# Patient Record
Sex: Female | Born: 1960 | Race: White | Hispanic: No | Marital: Married | Smoking: Current every day smoker
Health system: Southern US, Community
[De-identification: ages and names within clinical notes are randomized; demographics above are authoritative.]

## PROBLEM LIST (undated history)

## (undated) DIAGNOSIS — C4490 Unspecified malignant neoplasm of skin, unspecified: Secondary | ICD-10-CM

## (undated) DIAGNOSIS — G8929 Other chronic pain: Secondary | ICD-10-CM

## (undated) DIAGNOSIS — M1990 Unspecified osteoarthritis, unspecified site: Secondary | ICD-10-CM

## (undated) DIAGNOSIS — K219 Gastro-esophageal reflux disease without esophagitis: Secondary | ICD-10-CM

## (undated) DIAGNOSIS — I10 Essential (primary) hypertension: Secondary | ICD-10-CM

## (undated) DIAGNOSIS — J449 Chronic obstructive pulmonary disease, unspecified: Secondary | ICD-10-CM

## (undated) DIAGNOSIS — G709 Myoneural disorder, unspecified: Secondary | ICD-10-CM

## (undated) DIAGNOSIS — E785 Hyperlipidemia, unspecified: Secondary | ICD-10-CM

## (undated) DIAGNOSIS — C50919 Malignant neoplasm of unspecified site of unspecified female breast: Secondary | ICD-10-CM

## (undated) HISTORY — PX: TOTAL HIP ARTHROPLASTY: SHX124

## (undated) HISTORY — PX: BACK SURGERY: SHX140

---

## 2001-12-28 ENCOUNTER — Emergency Department (HOSPITAL_COMMUNITY): Admission: EM | Admit: 2001-12-28 | Discharge: 2001-12-28 | Payer: Self-pay | Admitting: *Deleted

## 2001-12-30 ENCOUNTER — Ambulatory Visit (HOSPITAL_COMMUNITY): Admission: RE | Admit: 2001-12-30 | Discharge: 2001-12-30 | Payer: Self-pay | Admitting: *Deleted

## 2001-12-30 ENCOUNTER — Encounter: Payer: Self-pay | Admitting: *Deleted

## 2001-12-30 ENCOUNTER — Emergency Department (HOSPITAL_COMMUNITY): Admission: EM | Admit: 2001-12-30 | Discharge: 2001-12-30 | Payer: Self-pay | Admitting: Emergency Medicine

## 2002-01-03 ENCOUNTER — Emergency Department (HOSPITAL_COMMUNITY): Admission: EM | Admit: 2002-01-03 | Discharge: 2002-01-03 | Payer: Self-pay | Admitting: Emergency Medicine

## 2002-01-12 ENCOUNTER — Observation Stay (HOSPITAL_COMMUNITY): Admission: RE | Admit: 2002-01-12 | Discharge: 2002-01-13 | Payer: Self-pay | Admitting: Neurosurgery

## 2002-01-12 ENCOUNTER — Encounter: Payer: Self-pay | Admitting: Neurosurgery

## 2003-08-27 ENCOUNTER — Emergency Department (HOSPITAL_COMMUNITY): Admission: EM | Admit: 2003-08-27 | Discharge: 2003-08-27 | Payer: Self-pay | Admitting: Emergency Medicine

## 2004-05-27 ENCOUNTER — Emergency Department: Payer: Self-pay | Admitting: General Practice

## 2005-08-09 ENCOUNTER — Emergency Department (HOSPITAL_COMMUNITY): Admission: EM | Admit: 2005-08-09 | Discharge: 2005-08-09 | Payer: Self-pay | Admitting: Emergency Medicine

## 2005-09-29 ENCOUNTER — Other Ambulatory Visit: Payer: Self-pay

## 2005-09-29 ENCOUNTER — Emergency Department: Payer: Self-pay | Admitting: Emergency Medicine

## 2005-10-17 ENCOUNTER — Emergency Department: Payer: Self-pay | Admitting: Unknown Physician Specialty

## 2005-10-31 ENCOUNTER — Ambulatory Visit: Payer: Self-pay | Admitting: General Surgery

## 2006-01-01 ENCOUNTER — Ambulatory Visit: Payer: Self-pay

## 2006-12-31 ENCOUNTER — Ambulatory Visit: Payer: Self-pay | Admitting: Emergency Medicine

## 2007-05-26 ENCOUNTER — Emergency Department: Payer: Self-pay | Admitting: Emergency Medicine

## 2008-04-05 ENCOUNTER — Ambulatory Visit: Payer: Self-pay | Admitting: Sports Medicine

## 2008-05-01 IMAGING — US ABDOMEN ULTRASOUND
1 series · 17 of 25 positions shown · non-contrast
Comparison: none

REASON FOR EXAM: Abdominal pain. Patient in RME 2
COMMENTS:

[Series 1: abdomen ultrasound · 17 of 74 slices shown]
[im 1/74]
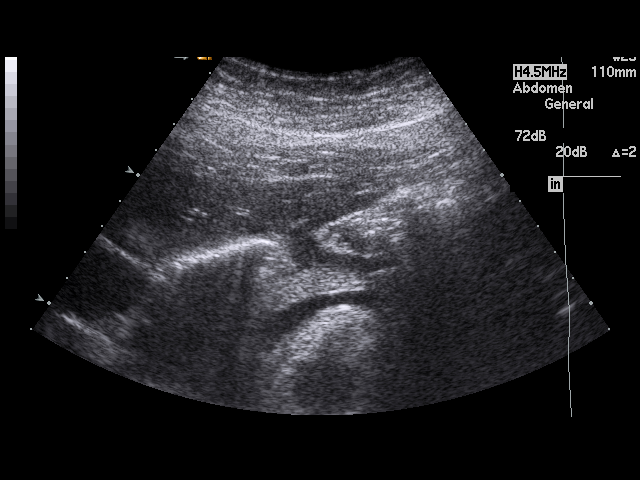
[im 7/74]
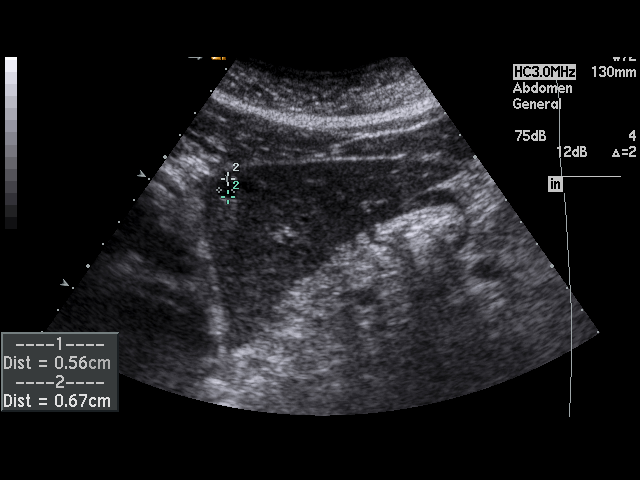
[im 10/74]
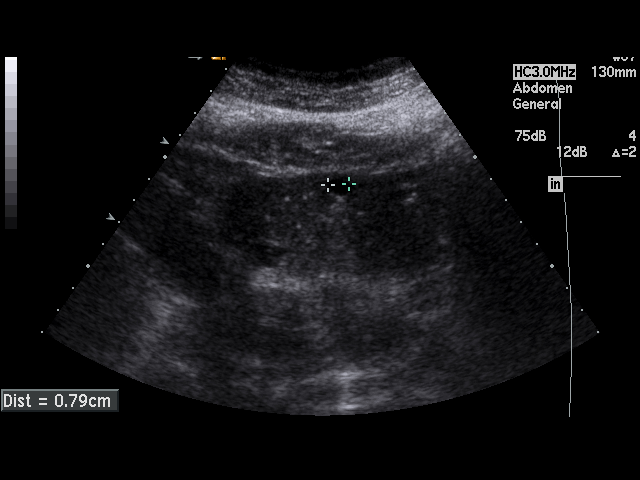
[im 16/74]
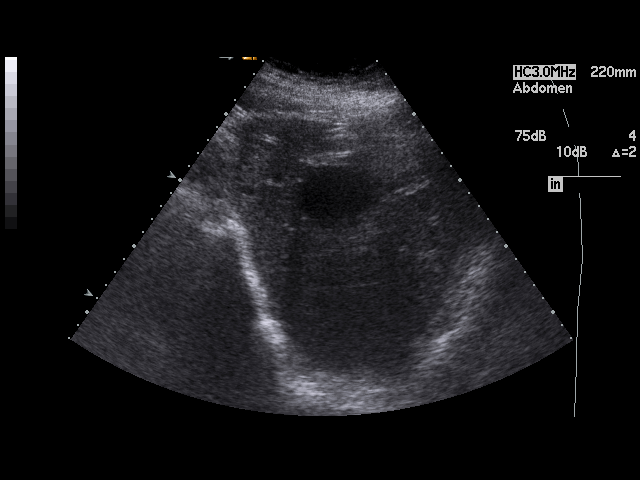
[im 19/74]
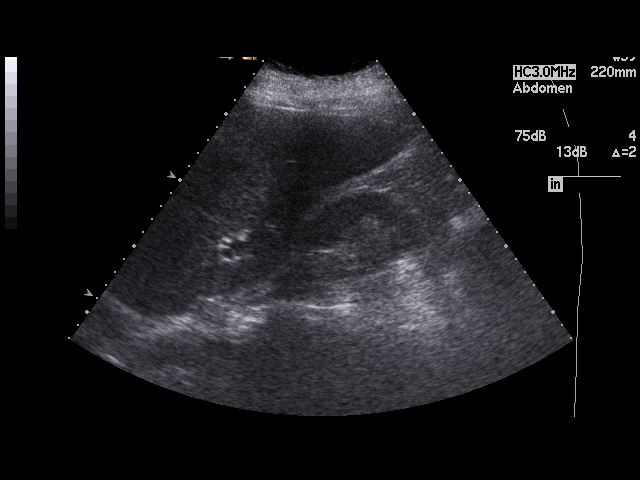
[im 25/74]
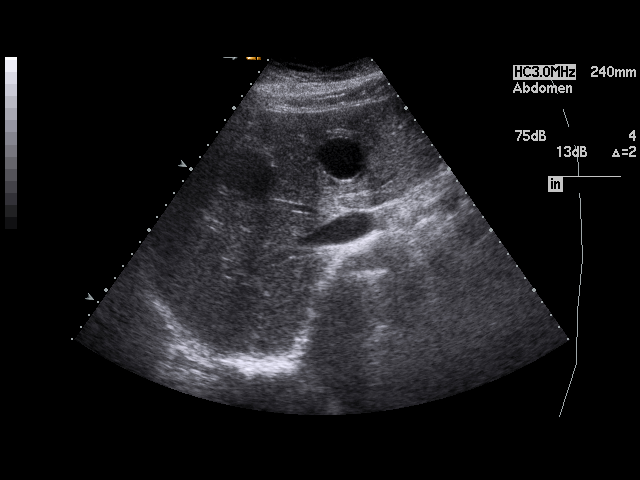
[im 28/74]
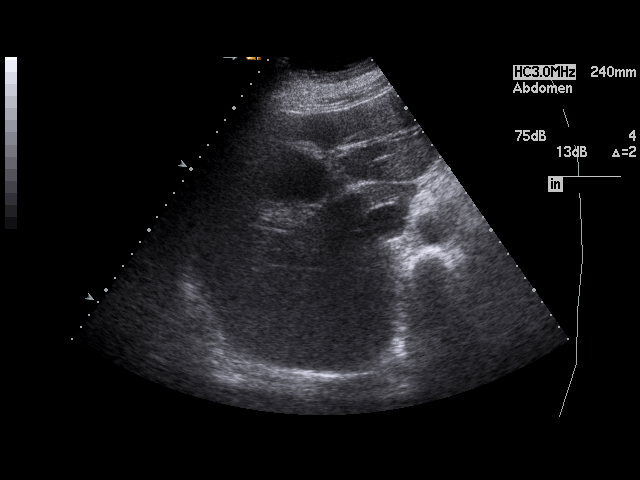
[im 34/74]
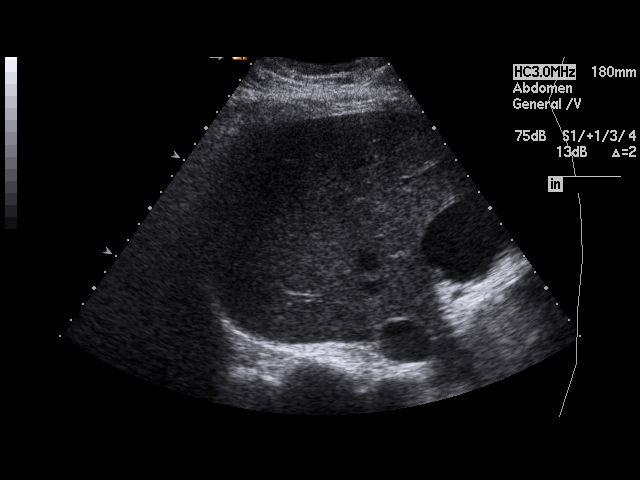
[im 37/74]
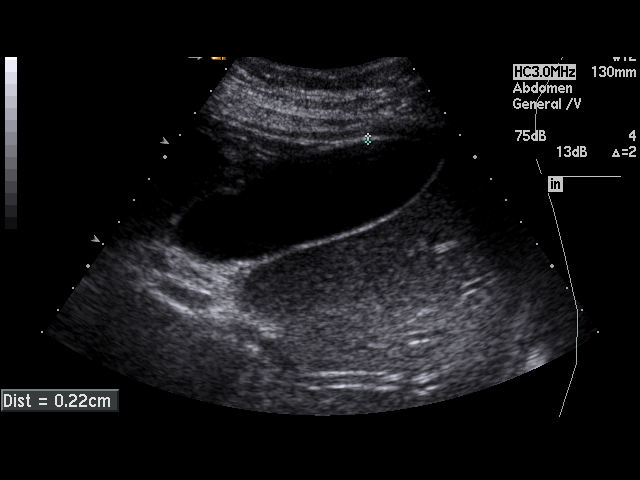
[im 40/74]
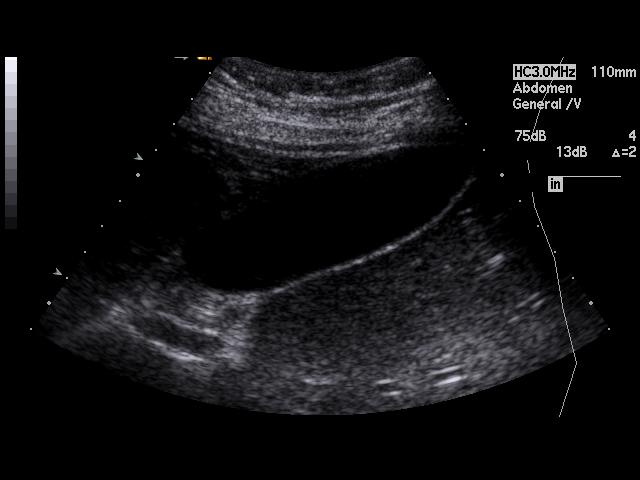
[im 46/74]
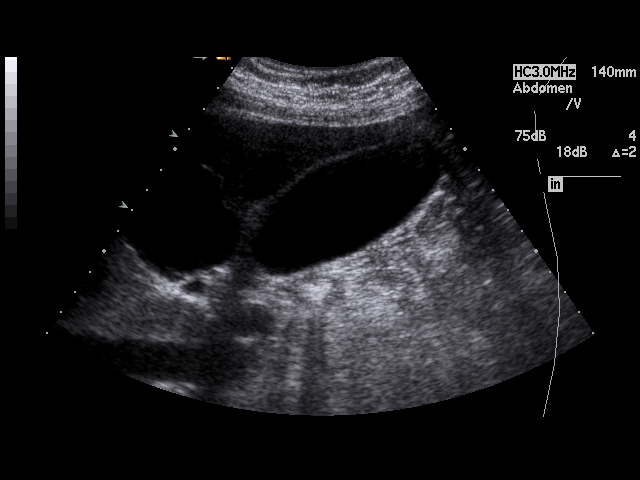
[im 49/74]
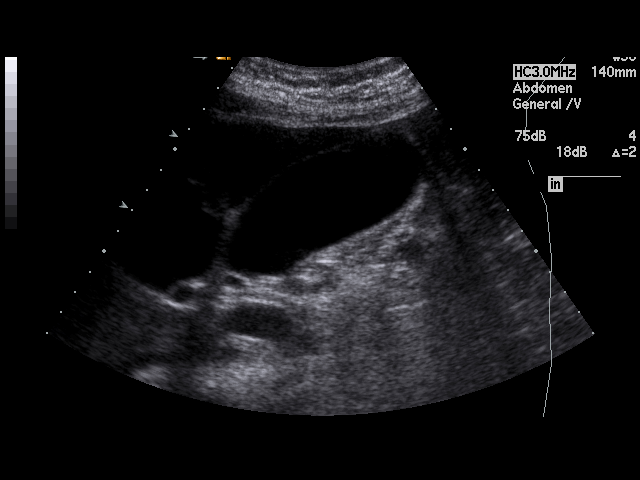
[im 55/74]
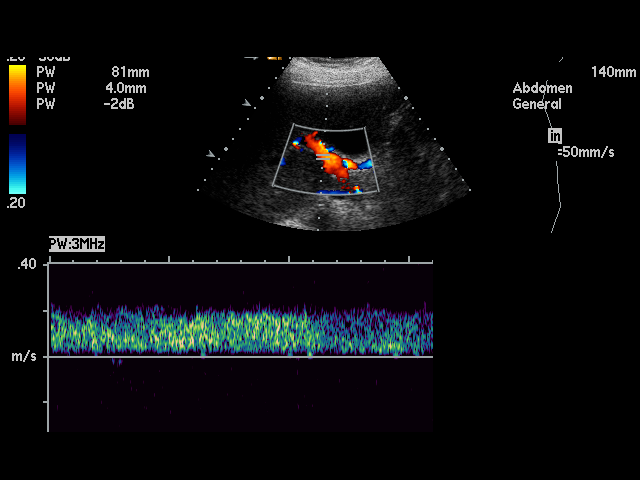
[im 58/74]
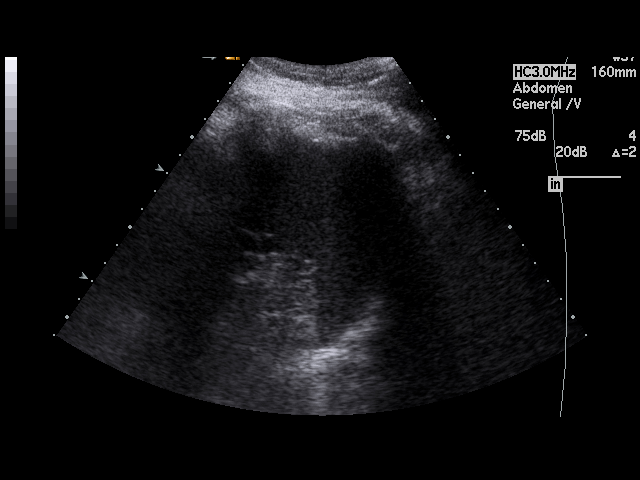
[im 64/74]
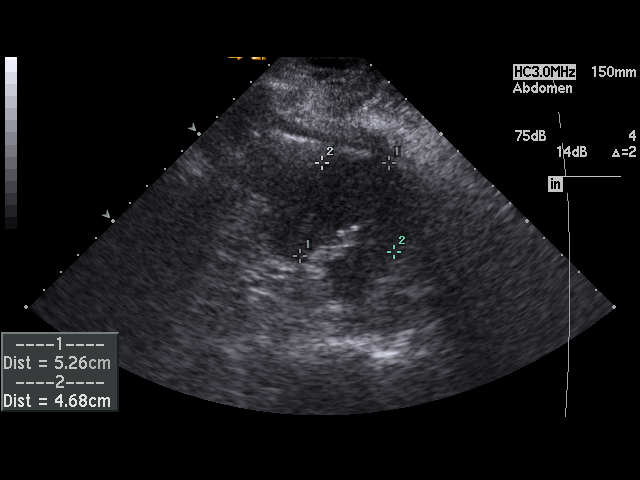
[im 67/74]
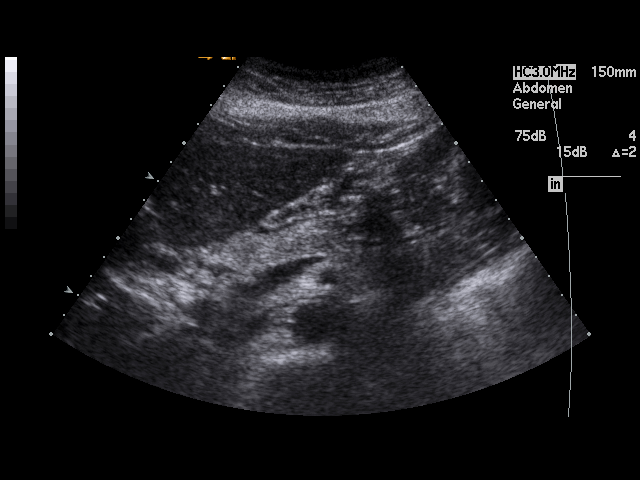
[im 74/74]
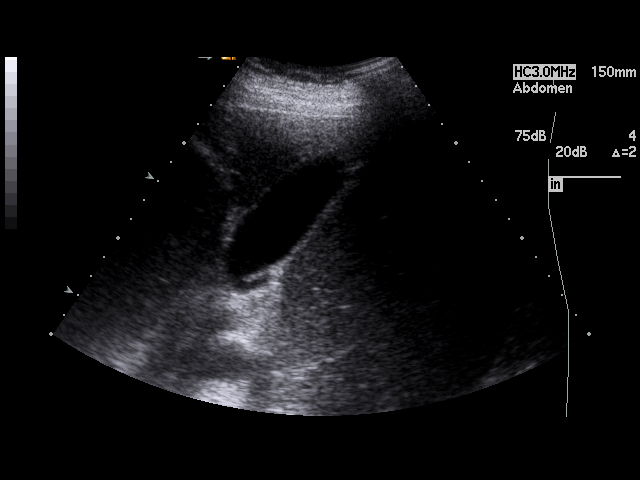

[17 of 25 positions shown; findings below may reference images not displayed]

PROCEDURE:     US  - US ABDOMEN GENERAL SURVEY  - September 29, 2005  [DATE]

RESULT:     The patient is complaining of one month of abdominal discomfort
and intermittent nausea.

The liver exhibits the presence of multiple anechoic structures consistent
with cysts.  The largest measures approximately 2.9 cm and lies in the LEFT
lobe near the porta hepatis.  A smaller cyst is noted adjacent to this in
the LEFT lobe and a smaller one still in the RIGHT lobe.  The gallbladder is
moderately distended with no evidence of stones, wall thickening, or
pericholecystic fluid.  There is no sonographic Murphy's sign.  The common
bile duct is normal at 2.7 mm in diameter. The pancreas could only be
partially demonstrated due to the presence of bowel gas.  The pancreatic
head and body were seen and were grossly normal.  The kidneys exhibit normal
echotexture with the RIGHT kidney measuring 11.2 and 11.7 cm in length.
There is no hydronephrosis.
IMPRESSION: There are at least three benign-appearing hepatic cysts.  There is no
intrahepatic ductal dilation or evidence of a solid mass.  There is no
evidence of ascites.

The gallbladder appears adequately distended with no evidence of stones.
Further evaluation with a nuclear medicine hepatobiliary scan with
gallbladder ejection fraction may be of value.

I see no acute abnormality elsewhere within the abdomen.  Survey views of
the abdominal aorta and spleen were normal.

## 2008-06-09 ENCOUNTER — Ambulatory Visit: Payer: Self-pay | Admitting: Unknown Physician Specialty

## 2008-06-15 ENCOUNTER — Inpatient Hospital Stay: Payer: Self-pay | Admitting: Unknown Physician Specialty

## 2008-06-20 ENCOUNTER — Ambulatory Visit: Payer: Self-pay | Admitting: Cardiology

## 2008-06-26 ENCOUNTER — Emergency Department: Payer: Self-pay | Admitting: Emergency Medicine

## 2009-01-23 ENCOUNTER — Emergency Department: Payer: Self-pay | Admitting: Emergency Medicine

## 2009-07-28 ENCOUNTER — Ambulatory Visit: Payer: Self-pay | Admitting: Unknown Physician Specialty

## 2009-08-10 ENCOUNTER — Ambulatory Visit: Payer: Self-pay | Admitting: Unknown Physician Specialty

## 2009-08-24 ENCOUNTER — Ambulatory Visit: Payer: Self-pay | Admitting: Unknown Physician Specialty

## 2010-04-16 ENCOUNTER — Emergency Department: Payer: Self-pay | Admitting: Unknown Physician Specialty

## 2013-10-16 ENCOUNTER — Inpatient Hospital Stay: Payer: Self-pay | Admitting: Internal Medicine

## 2013-10-16 LAB — CBC
HCT: 44.2 % (ref 35.0–47.0)
HGB: 14.4 g/dL (ref 12.0–16.0)
MCH: 33.4 pg (ref 26.0–34.0)
MCHC: 32.5 g/dL (ref 32.0–36.0)
MCV: 103 fL — ABNORMAL HIGH (ref 80–100)
Platelet: 242 10*3/uL (ref 150–440)
RBC: 4.3 10*6/uL (ref 3.80–5.20)
RDW: 13.5 % (ref 11.5–14.5)
WBC: 11 10*3/uL (ref 3.6–11.0)

## 2013-10-16 LAB — ETHANOL
Ethanol %: <0.003 % (ref 0.000–0.080)
Ethanol: <3 mg/dL

## 2013-10-16 LAB — COMPREHENSIVE METABOLIC PANEL
ALK PHOS: 84 U/L
ANION GAP: 5 — AB (ref 7–16)
AST: 13 U/L — AB (ref 15–37)
Albumin: 3.4 g/dL (ref 3.4–5.0)
BILIRUBIN TOTAL: 0.2 mg/dL (ref 0.2–1.0)
BUN: 12 mg/dL (ref 7–18)
CALCIUM: 8.5 mg/dL (ref 8.5–10.1)
CREATININE: 0.92 mg/dL (ref 0.60–1.30)
Chloride: 99 mmol/L (ref 98–107)
Co2: 33 mmol/L — ABNORMAL HIGH (ref 21–32)
EGFR (African American): >60
GLUCOSE: 162 mg/dL — AB (ref 65–99)
Osmolality: 277 (ref 275–301)
POTASSIUM: 3.8 mmol/L (ref 3.5–5.1)
SGPT (ALT): 13 U/L — ABNORMAL LOW
Sodium: 137 mmol/L (ref 136–145)
TOTAL PROTEIN: 7.3 g/dL (ref 6.4–8.2)

## 2013-10-16 LAB — URINALYSIS, COMPLETE
Blood: NEGATIVE
Glucose,UR: NEGATIVE mg/dL (ref 0–75)
Hyaline Cast: 2
Leukocyte Esterase: NEGATIVE
Nitrite: NEGATIVE
Ph: 5 (ref 4.5–8.0)
SPECIFIC GRAVITY: 1.035 (ref 1.003–1.030)
Squamous Epithelial: NONE SEEN
WBC UR: 4 /HPF (ref 0–5)

## 2013-10-16 LAB — DRUG SCREEN, URINE
AMPHETAMINES, UR SCREEN: NEGATIVE (ref ?–1000)
Barbiturates, Ur Screen: NEGATIVE (ref ?–200)
Benzodiazepine, Ur Scrn: NEGATIVE (ref ?–200)
CANNABINOID 50 NG, UR ~~LOC~~: NEGATIVE (ref ?–50)
Cocaine Metabolite,Ur ~~LOC~~: NEGATIVE (ref ?–300)
MDMA (Ecstasy)Ur Screen: POSITIVE (ref ?–500)
Methadone, Ur Screen: NEGATIVE (ref ?–300)
Opiate, Ur Screen: NEGATIVE (ref ?–300)
PHENCYCLIDINE (PCP) UR S: NEGATIVE (ref ?–25)
Tricyclic, Ur Screen: POSITIVE (ref ?–1000)

## 2013-10-16 LAB — TROPONIN I: TROPONIN-I: 0.03 ng/mL

## 2013-10-18 LAB — BASIC METABOLIC PANEL
BUN: 6 mg/dL — AB (ref 7–18)
CHLORIDE: 104 mmol/L (ref 98–107)
CREATININE: 0.78 mg/dL (ref 0.60–1.30)
Calcium, Total: 8 mg/dL — ABNORMAL LOW (ref 8.5–10.1)
Co2: 35 mmol/L — ABNORMAL HIGH (ref 21–32)
EGFR (Non-African Amer.): >60
Glucose: 86 mg/dL (ref 65–99)
Osmolality: 267 (ref 275–301)
Potassium: 3.2 mmol/L — ABNORMAL LOW (ref 3.5–5.1)
SODIUM: 135 mmol/L — AB (ref 136–145)

## 2013-10-18 LAB — POTASSIUM: Potassium: 4.2 mmol/L (ref 3.5–5.1)

## 2013-10-21 LAB — CULTURE, BLOOD (SINGLE)

## 2014-07-08 NOTE — H&P (Signed)
PATIENT NAME:  Tara Norris, Tara Norris MR#:  [id number] DATE OF BIRTH:  26-Nov-1960  DATE OF ADMISSION:  10/16/2013  PRIMARY CARE PHYSICIAN: At [facility].   CHIEF COMPLAINT: Unresponsiveness and altered mental status.   HISTORY OF PRESENT ILLNESS: This is a 54 year old female who was found unresponsive in her home earlier today. Husband called EMS. When EMS arrived, they gave her some Narcan and she shortly woke up right after she got the Narcan. She then came to the ER and was still quite lethargic and altered. She received 1 dose of Narcan and her mental status is now back to baseline. The patient apparently has chronic back pain and has recently started on a fentanyl patch which was increased from 50 mcg to 75 mcg. She has also been given a new prescription for oxycodone 15 mg every 4 hours as needed for pain although she has only been taking 5 mg at a time along with the higher dose of fentanyl. She also while in the ER was noted to be hypoxic with oxygen saturations in the low to mid 80s. Her chest x-ray and her CT scan are consistent with a possible aspiration pneumonia. Hospitalist services were contacted for further treatment and evaluation.   REVIEW OF SYSTEMS:    CONSTITUTIONAL: No documented fever. No weight gain or weight loss.  EYES: No blurry or double vision.  ENT: No tinnitus. No postnasal drip. No redness of the oropharynx.  RESPIRATORY: No cough, no wheeze, no hemoptysis, no dyspnea.  CARDIOVASCULAR: No chest pain, no orthopnea or palpitation. No syncope.  GASTROINTESTINAL: No nausea, no vomiting, diarrhea. No abdominal pain. No melena or hematochezia.  GENITOURINARY: No dysuria or hematuria.  ENDOCRINE: No polyuria or nocturia. No heat or cold intolerance.  HEMATOLOGIC: No anemia, no bruising, no bleeding.  INTEGUMENTARY: No rashes or lesions.  MUSCULOSKELETAL: No arthritis. No swelling. No gout.  NEUROLOGIC: No numbness or tingling. No ataxia. No seizure-type activity.   PSYCHIATRIC: Positive depression. No anxiety. No ADD.   PAST MEDICAL HISTORY: Consistent with chronic back pain, hyperlipidemia, gastroesophageal reflux disease, ongoing tobacco abuse.   ALLERGIES: CODEINE, DILAUDID AND ERYTHROMYCIN.   SOCIAL HISTORY: She does smoke about a pack per day, has been smoking last 30+ years. No alcohol abuse. No illicit drug abuse. Lives at home with her husband.   FAMILY HISTORY: The patient's mother is alive, has a history of supraventricular tachycardia and atrial fibrillation.  Father died heart complications of renal failure.   CURRENT MEDICATIONS: As follows: Flexeril 5 mg q.8 hours as needed, fentanyl patch 75 mcg every 3 days, fluoxetine 40 mg daily, gabapentin 300 mg 3 caps t.i.d., lansoprazole 30 mg daily, Naprosyn 5 mg b.i.d., Zofran 4 mg as needed, oxycodone 5 mg 3 tabs every 6 hours as needed, Pravachol 20 mg daily, trazodone 100 mg at bedtime and Ambien 10 mg at bedtime as needed.   PHYSICAL EXAMINATION: Presently is as follows:  VITAL SIGNS:  Temperature is 97.8, pulse 101, respirations 18, blood pressure 97/74, sats 98% on 2 liters nasal cannula.  GENERAL: She is a pleasant-appearing female, lethargic, but in no apparent distress.  HEAD, EYES, EARS, NOSE AND THROAT:  She is atraumatic, normocephalic. Her extraocular muscles are intact. Her pupils are equal and reactive eye to light. Her sclerae are anicteric. No conjunctival injection. No pharyngeal erythema.  NECK: Supple. There is no jugular venous distention. No bruits, no lymphadenopathy. No thyromegaly.  HEART: Regular rate and rhythm. No murmurs, no rubs or clicks.  LUNGS:  She has some coarse rhonchi in the left lower base, some minimal end-expiratory wheezing. No use of accessory muscles. No dullness to percussion.  ABDOMEN: Soft, flat, nontender, nondistended. Has good bowel sounds. No hepatosplenomegaly appreciated.  EXTREMITIES: No evidence of any cyanosis, clubbing or peripheral edema. Has  +2 pedal and radial pulses bilaterally.  NEUROLOGICAL: The patient is alert, awake and oriented x 3 with no focal motor or sensory deficits appreciated bilaterally.  SKIN: Moist and warm with no rashes appreciated.  LYMPHATIC: There is no cervical or axillary lymphadenopathy.     LABORATORY, DIAGNOSTIC AND RADIOLOGICAL DATA:  Serum glucose 162, BUN 12, creatinine 0.9, sodium 137, potassium 3.8, chloride 99, bicarbonate 33. LFTs within normal limits. Troponin 0.03. White cell count 11, hemoglobin 14.4, hematocrit 44.2, platelet count 242. Patient's urine toxicology shows positive for tricyclic antidepressants and MDMA. Urinalysis is within normal limits.   The patient did have a CT of the head done without contrast which showed no acute intracranial abnormality.  The patient also had a chest x-ray done which showed left basilar airspace disease and atelectasis. This may represent asymmetric pulmonary edema or pneumonia.   ASSESSMENT AND PLAN: A 54 year old female with history of chronic back pain, hyperlipidemia, gastroesophageal reflux disease, ongoing tobacco abuse, who was found unresponsive at home. She was given some Narcan and her mental status improved and back to baseline. She was noted to be hypoxic with a left lower lobe infiltrate, suspected to have aspiration pneumonia.  1.  Aspiration pneumonia. This was likely due to her unresponsiveness from being on high-dose narcotics. I will treat the patient with IV Zosyn, follow sputum and blood cultures and follow her clinically. Some of her hypoxemia is probably from her underlying chronic obstructive pulmonary disease as she has a 30 pack-year smoking history.  2.  Altered mental status/unresponsiveness likely to high-dose narcotics as the patient's mental status significantly improved with some Narcan. Her fentanyl patch was increased from 50 to 75 mcg recently, therefore, I will hold that for now. Continue some p.r.n. oxycodone for pain. Follow her  mental status. Her CT head is negative.  3.  Chronic back pain. Continue p.r.n. oxycodone for pain. Hold fentanyl patch given her unresponsiveness and altered mental status.  4.  Gastroesophageal reflux disease. Continue Protonix.  5.  Hyperlipidemia. Continue Pravachol.  6.  CODE STATUS: The patient is a full code.   TIME SPENT ON ADMISSION: 50 minutes.    ____________________________ Belia Heman. Verdell Carmine, MD vjs:cs D: 10/16/2013 18:57:13 ET T: 10/16/2013 19:39:15 ET JOB#: [id number]  cc: Belia Heman. Verdell Carmine, MD, <Dictator> Henreitta Leber MD ELECTRONICALLY SIGNED 10/28/2013 14:24

## 2014-07-08 NOTE — Discharge Summary (Signed)
PATIENT NAME:  Tara Norris, Tara Norris MR#:  [id number] DATE OF BIRTH:  30-Jul-1960  DATE OF ADMISSION:  10/16/2013 DATE OF DISCHARGE:  10/18/2013  PRIMARY CARE PHYSICIAN:  [facility].   ADMITTING DIAGNOSIS: Unresponsiveness and altered mental status.    DISCHARGE DIAGNOSES: 1.  Acute toxic encephalopathy due to fentanyl.  2.  Acute respiratory failure.  3.  Aspiration pneumonia.  4.  Chronic back pain.  5.  Hypokalemia.  6.  Depression and insomnia.  7.  Probable chronic obstructive pulmonary disease.   CONSULTATIONS:  None.   PROCEDURES:  None.  BRIEF HISTORY:  This 54 year old female was found unresponsive in her home by her husband. Her husband describes finding her obtunded, gasping for air, with saliva foaming from the mouth. EMS administered Narcan which caused her to awake briefly. She then became lethargic again and was brought to the Emergency Room.   HOSPITAL COURSE AND TREATMENT:   1.  Acute toxic encephalopathy due to fentanyl: At the time of discharge the patient is alert and oriented x 4. Fentanyl was discontinued upon admission as it was most likely the cause of her symptoms. She will resume the oral regimen of OxyContin she had been taking prior to starting fentanyl. She will continue to work with her primary care physician on pain control.  2.  Acute respiratory failure: At the time of discharge, she is breathing comfortably on 2 liters via nasal cannula. Initially on admission, she was placed briefly on BiPAP, she was then able to transition to nasal cannula at 6 liters and titrating down to 2 liters. Oxygen challenge today showed desaturations to 86 with ambulation off of oxygen. She is going home with home health   oxygen at 2 liters continuous. She will follow up with her primary care physician in the next 1 to 2 weeks to discuss whether this will be necessary for the long term. Chest x-ray on admission showed left basilar airspace disease, likely pulmonary edema versus  aspiration pneumonia. Repeat chest x-ray on the day of discharge showed that this consolidation had improved. There was no edema, no effusion or pneumothorax. There was right lung base subsegmental atelectasis. She is being discharged with an incentive spirometer and encouraged to use it several times a day.  3.  Aspiration pneumonia versus pulmonary edema in the setting of hypoventilation: Respiratory status as discussed above. She is discharged on Levaquin to cover for possible aspiration pneumonia. She will continue for a total of 7 days of antibiotic treatment.  4.  Chronic back pain: This has been a continuous issue for this patient. She will discontinue use of fentanyl patches. She will restart her oral pain control regimen and continue to work with her primary care physician.  5.  Hypokalemia: Potassium was repleted during this admission.  6.  Depression and insomnia.  She will continue her home regimen of fluoxetine, trazodone, Zolpidem.   7.  Hyperlipidemia: Continue statin.  8.  Tobacco abuse: She is discharged with a prescription for nicotine patches. She was given counseling during this admission on tobacco cessation and seems ready to quit.  9.  COPD:  This patient has a long history of smoking greater than 40 pack-years and continues to smoke. At this time, we do not have PFTs, they should be done in the outpatient setting after this acute event.  I suspect the reason she continues to be hypoxic after this event is that she has underlying COPD.    DISCHARGE EXAMINATION:  VITAL SIGNS:  Temperature 98 degrees, pulse 100, respirations 20, blood pressure 120/83, pulse oximetry 97% on 2 liters with desaturations to 86 with exertion on room air.  GENERAL:  The patient is alert, oriented, in no acute distress.  HEENT: Pupils are equal, round, and reactive, conjunctivae are clear. Oral mucosa is pink and moist. Oropharynx is clear.  CARDIOVASCULAR: Regular rate and rhythm; no murmurs, rubs, or  gallops.  PULMONARY: No respiratory distress, lungs are clear to auscultation bilaterally with good air movement.  ABDOMEN:  Bowel sounds are positive. Abdomen is soft and nontender.  EXTREMITIES:  Pulses are 2+. There is no edema.  NEUROLOGIC: Exam is nonfocal, cranial nerves are grossly intact.  PSYCHIATRIC: Affect is normal.  Patient shows no signs of uncontrolled depression or anxiety.    LABORATORY DATA:  On the day of discharge, sodium 135, potassium 3.5, chloride 104, bicarb 35, BUN 6, creatinine 0.78, glucose 86.  IMAGING:  Chest x-ray as discussed above, shows right lung base subsegmental atelectasis, no edema, no evidence of pneumonia.   DISPOSITION:   The patient is advised to follow up with her primary care physician in the next 7-10 days.  DISCHARGE MEDICATIONS:   1.  Cyclobenzaprine 5 mg 1 tablet every 8 hours as needed for muscle spasm.  2.  Naproxen 500 mg 1 tablet 2 times a day as needed for pain.  3.  Oxycodone 5 mg 3 tablets orally every 6 hours as needed for pain (this prescription has been provided by her primary care provider; she was not given a prescription upon discharge).  4.  Gabapentin 300 mg 3 capsules orally 3 times a day.  5.  Zolpidem 10 mg 1 tablet once a day at bedtime as needed for insomnia.  6.  Fluoxetine 40 mg 1 capsule once a day.  7.  Trazodone 50 mg 2 tablets orally once a day at bedtime as needed for sleep.  8.  Zofran 4 mg 1 tablet once a day as needed for nausea.  9.  Pravastatin 20 mg 1 tablet once a day.  10.  Lansoprazole 30 mg 1 tablet daily.  11.  Levofloxacin 500 mg 1 tablet once a day.  12.  Nicotine 14 mg/24 hour transdermal patch, 1 patch once a day.  13.  Albuterol CFC free 90 mcg inhaler 2 puffs inhaled 4 times a day as needed for shortness of breath.  14.  Oxygen 2 liters per minute via nasal cannula continuous for suspected COPD with hypoxia, portable gas tank.   Note the patient will stop using fentanyl transdermal patch.    DISCHARGE INSTRUCTIONS: 1.  Use incentive spirometer multiple times a day as instructed.  2.  Keep followup appointment with primary care provider in the next 7-10 days.  3.  Continue and complete course of antibiotics.   DIET:  Regular diet.    ACTIVITY: No restrictions.   time spent on discharge 50 minutes ____________________________ Earleen Newport. Volanda Napoleon, MD cpw:lb D: 10/18/2013 16:02:00 ET T: 10/18/2013 16:22:05 ET JOB#: [id number]  cc: Barnetta Chapel P. Volanda Napoleon, MD, <Dictator> Aldean Jewett MD ELECTRONICALLY SIGNED 10/19/2013 15:17

## 2016-04-24 ENCOUNTER — Ambulatory Visit: Payer: Medicare HMO

## 2016-05-18 IMAGING — CT CT HEAD WITHOUT CONTRAST
1 series · 16 of 30 positions shown, 20 images · non-contrast
Comparison: None.

CLINICAL DATA: Unresponsive

EXAM:
CT HEAD WITHOUT CONTRAST
TECHNIQUE: Contiguous axial images were obtained from the base of the skull
through the vertex without intravenous contrast.

[Series 2: soft tissue · axial · 0.41mm/px · z∈[-114,+21]mm · 16 of 31 slices shown, 20 images]
[im 2/31  brain]
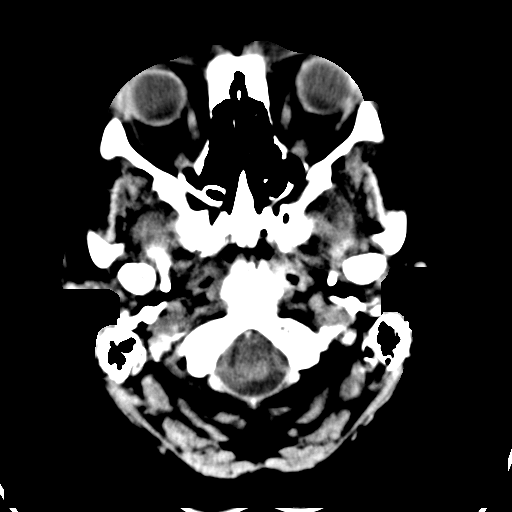
[im 2/31  bone]
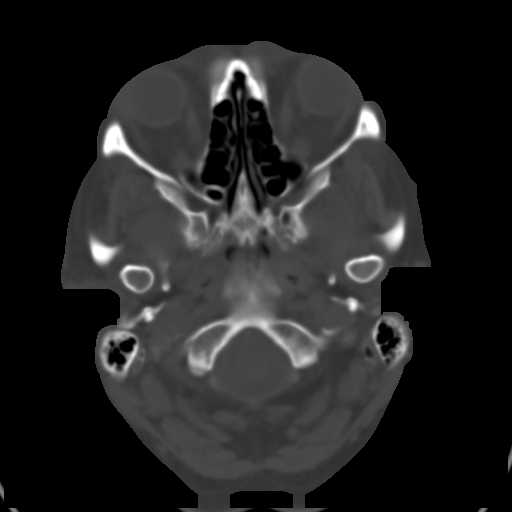
[im 4/31  brain]
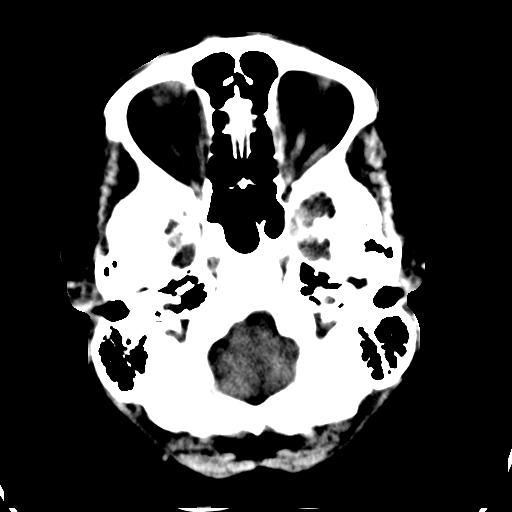
[im 6/31  brain]
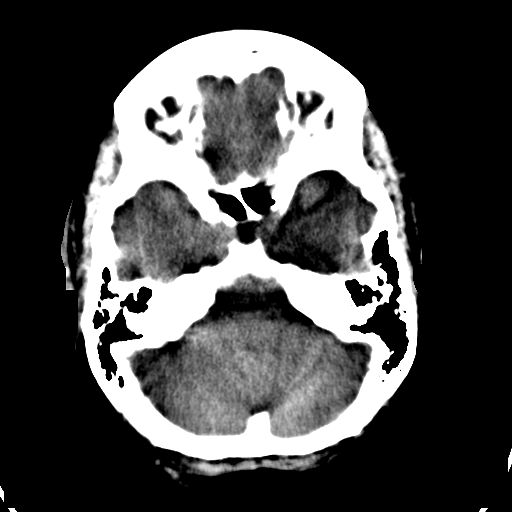
[im 8/31  brain]
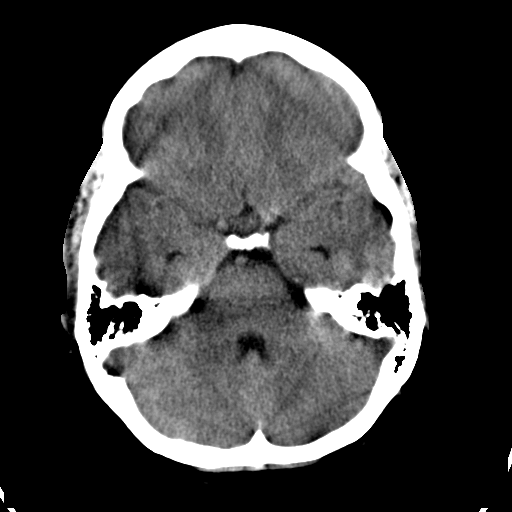
[im 9/31  brain]
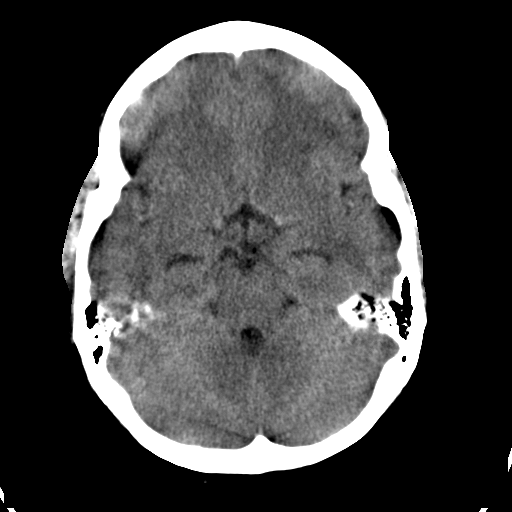
[im 9/31  bone]
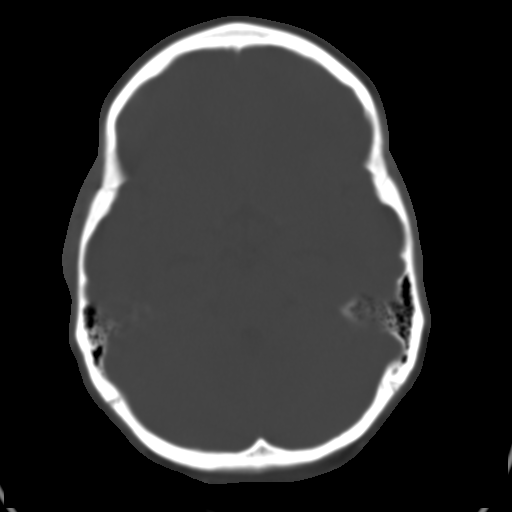
[im 11/31  brain]
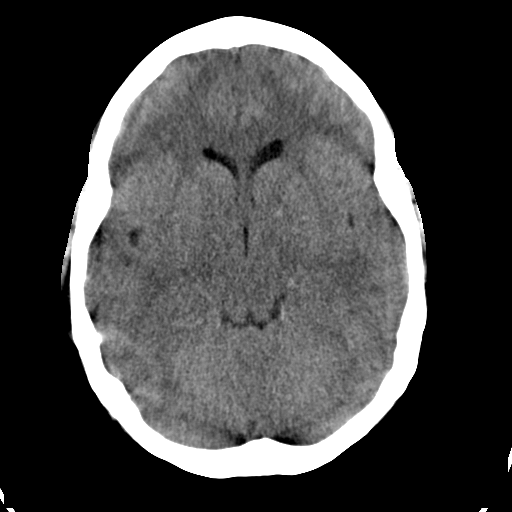
[im 13/31  brain]
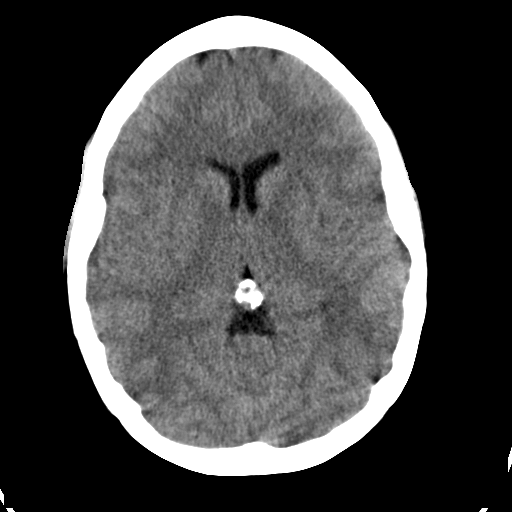
[im 15/31  brain]
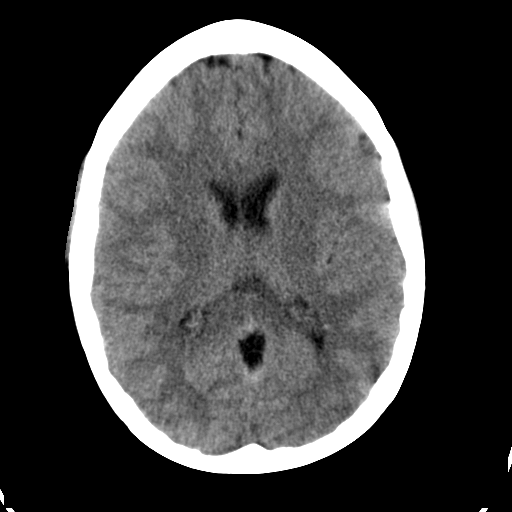
[im 16/31  brain]
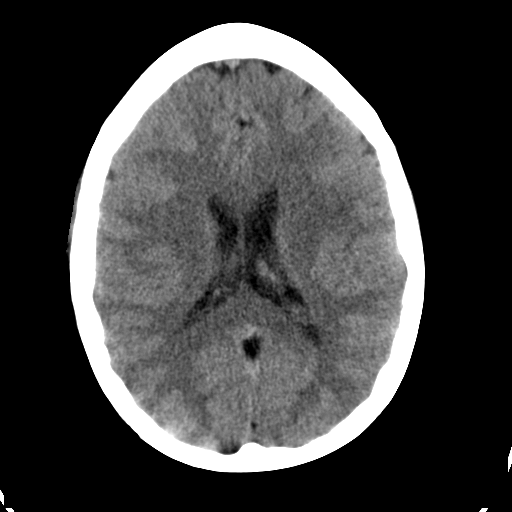
[im 16/31  bone]
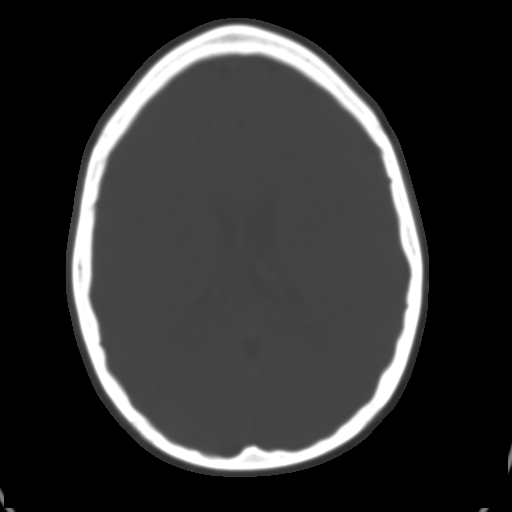
[im 18/31  brain]
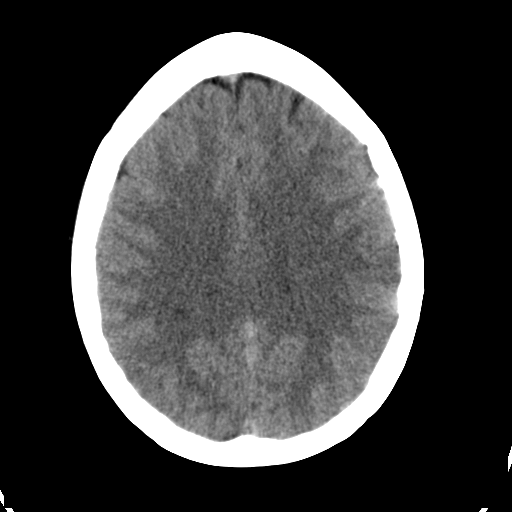
[im 20/31  brain]
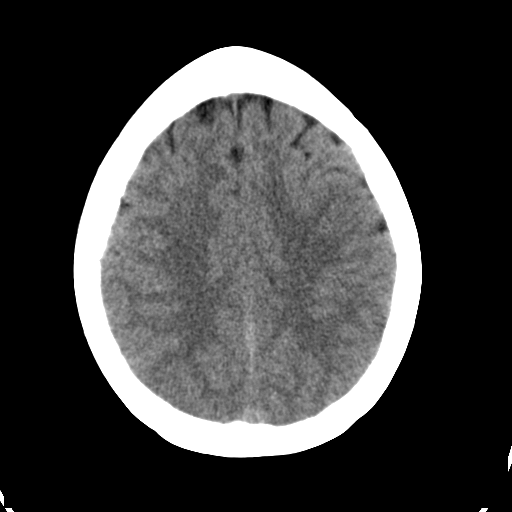
[im 22/31  brain]
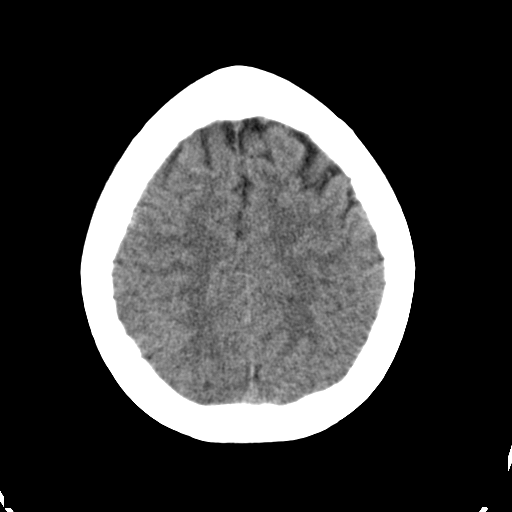
[im 23/31  brain]
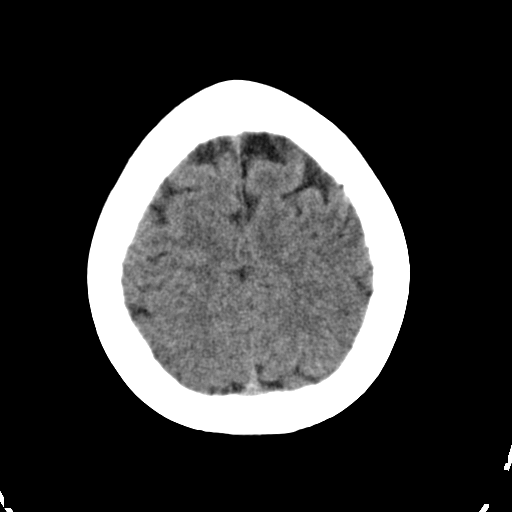
[im 23/31  bone]
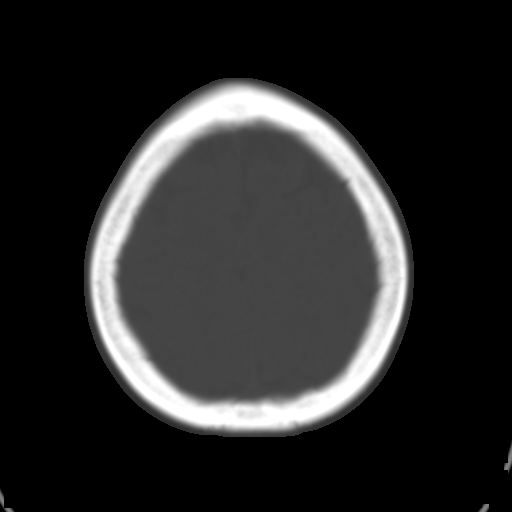
[im 25/31  brain]
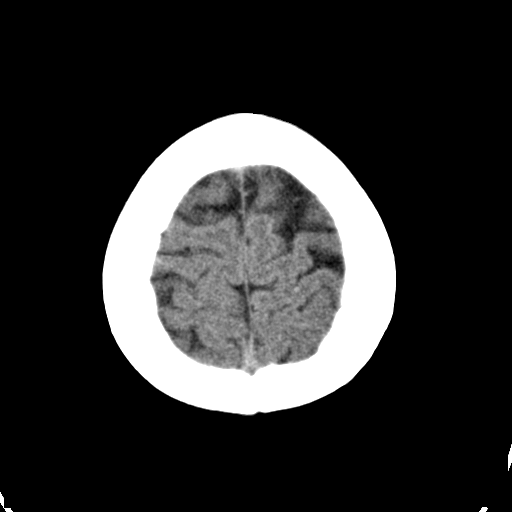
[im 27/31  brain]
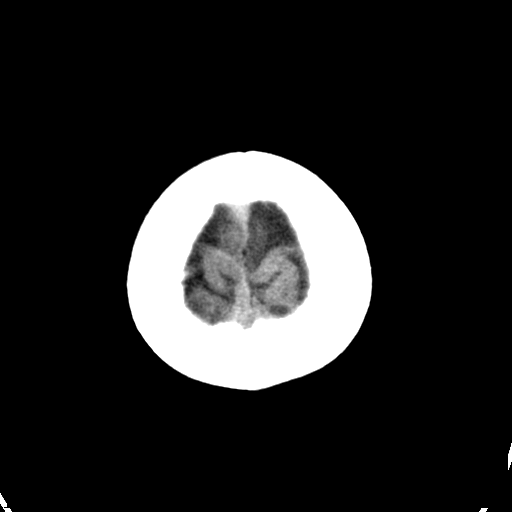
[im 29/31  brain]
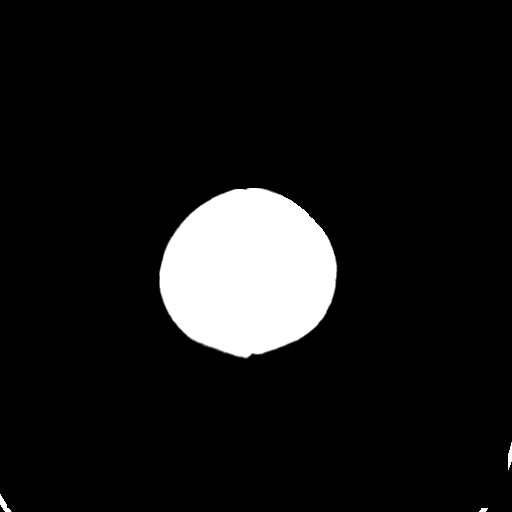

[16 of 30 positions shown; findings below may reference images not displayed]

FINDINGS: The bony calvarium is intact. The ventricles are of normal size and
configuration. No findings to suggest acute hemorrhage, acute
infarction or space-occupying mass lesion are noted.
IMPRESSION: No acute intracranial abnormality is noted.

## 2017-01-15 DIAGNOSIS — C50919 Malignant neoplasm of unspecified site of unspecified female breast: Secondary | ICD-10-CM

## 2017-01-15 HISTORY — DX: Malignant neoplasm of unspecified site of unspecified female breast: C50.919

## 2017-02-03 HISTORY — PX: BREAST LUMPECTOMY: SHX2

## 2017-02-16 ENCOUNTER — Encounter (INDEPENDENT_AMBULATORY_CARE_PROVIDER_SITE_OTHER): Payer: Self-pay

## 2017-02-16 ENCOUNTER — Other Ambulatory Visit: Payer: Self-pay

## 2017-02-16 ENCOUNTER — Encounter: Payer: Self-pay | Admitting: Radiation Oncology

## 2017-02-16 ENCOUNTER — Ambulatory Visit
Admission: RE | Admit: 2017-02-16 | Discharge: 2017-02-16 | Disposition: A | Discharge status: Home / Self Care | Payer: Medicare HMO | Source: Ambulatory Visit | Attending: Radiation Oncology | Admitting: Radiation Oncology

## 2017-02-16 VITALS — BP 141/88 | HR 89 | Temp 97.8°F | Resp 18 | Ht 68.0 in | Wt 228.0 lb

## 2017-02-16 DIAGNOSIS — Z96642 Presence of left artificial hip joint: Secondary | ICD-10-CM | POA: Diagnosis not present

## 2017-02-16 DIAGNOSIS — G8929 Other chronic pain: Secondary | ICD-10-CM | POA: Insufficient documentation

## 2017-02-16 DIAGNOSIS — J449 Chronic obstructive pulmonary disease, unspecified: Secondary | ICD-10-CM | POA: Insufficient documentation

## 2017-02-16 DIAGNOSIS — Z7982 Long term (current) use of aspirin: Secondary | ICD-10-CM | POA: Insufficient documentation

## 2017-02-16 DIAGNOSIS — F329 Major depressive disorder, single episode, unspecified: Secondary | ICD-10-CM | POA: Diagnosis not present

## 2017-02-16 DIAGNOSIS — Z170 Estrogen receptor positive status [ER+]: Secondary | ICD-10-CM | POA: Diagnosis not present

## 2017-02-16 DIAGNOSIS — F17210 Nicotine dependence, cigarettes, uncomplicated: Secondary | ICD-10-CM | POA: Insufficient documentation

## 2017-02-16 DIAGNOSIS — Z510 Encounter for antineoplastic radiation therapy: Secondary | ICD-10-CM | POA: Insufficient documentation

## 2017-02-16 DIAGNOSIS — K219 Gastro-esophageal reflux disease without esophagitis: Secondary | ICD-10-CM | POA: Diagnosis not present

## 2017-02-16 DIAGNOSIS — I10 Essential (primary) hypertension: Secondary | ICD-10-CM | POA: Diagnosis not present

## 2017-02-16 DIAGNOSIS — Z79899 Other long term (current) drug therapy: Secondary | ICD-10-CM | POA: Insufficient documentation

## 2017-02-16 DIAGNOSIS — Z85828 Personal history of other malignant neoplasm of skin: Secondary | ICD-10-CM | POA: Insufficient documentation

## 2017-02-16 DIAGNOSIS — D0511 Intraductal carcinoma in situ of right breast: Secondary | ICD-10-CM | POA: Diagnosis not present

## 2017-02-16 DIAGNOSIS — E785 Hyperlipidemia, unspecified: Secondary | ICD-10-CM | POA: Diagnosis not present

## 2017-02-16 HISTORY — DX: Unspecified osteoarthritis, unspecified site: M19.90

## 2017-02-16 HISTORY — DX: Myoneural disorder, unspecified: G70.9

## 2017-02-16 HISTORY — DX: Essential (primary) hypertension: I10

## 2017-02-16 HISTORY — DX: Hyperlipidemia, unspecified: E78.5

## 2017-02-16 HISTORY — DX: Gastro-esophageal reflux disease without esophagitis: K21.9

## 2017-02-16 HISTORY — DX: Other chronic pain: G89.29

## 2017-02-16 HISTORY — DX: Unspecified malignant neoplasm of skin, unspecified: C44.90

## 2017-02-16 HISTORY — DX: Chronic obstructive pulmonary disease, unspecified: J44.9

## 2017-02-16 HISTORY — DX: Malignant neoplasm of unspecified site of unspecified female breast: C50.919

## 2017-02-16 NOTE — Progress Notes (Signed)
NEW PATIENT EVALUATION  Name: Tara Norris  MRN: [id number]  Date:   02/16/2017     DOB: 14-Apr-1960   This 56 y.o. 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  REFERRING PHYSICIAN: No ref. provider found  CHIEF COMPLAINT:  Chief Complaint  Patient presents with  . Breast Cancer    Pt is here for initial consultation of breast cancer.     DIAGNOSIS: The encounter diagnosis was Ductal carcinoma in situ (DCIS) of right breast.   PREVIOUS INVESTIGATIONS:  Mammograms reviewed Surgical pathology reports reviewed Clinical notes reviewed  HPI: Patient is a 56 year old female who presented with an abnormal mammogram of the right breast back 11/26/2016. There was asymmetry in the lateral right breast spanning 5 cm. Stereotactic biopsy showed ductal carcinoma in situ nuclear grade 2 cribriform type with central necrosis. No invasive carcinoma was identified. MRI of the breast showed an area of 5 cm of linear non-mass enhancement in the right breast lower outer middle depth. She went on to have a wide local excision showing 4.4 cm of grade 2 ductal carcinoma in situ. One margin was close at less than 2 mm although reexcised with negative margins. Tumor was strongly ER positive PR was borderline. No Lymph nodes were submitted. She tolerated her surgery well. Patient has multiple medical comorbidities including left hip replacement COPD depression arthritis and chronic pain syndrome. Radiation therapy has been recommended she seen today closer to home from [facility] where all of her surgery was performed. She is healing well. She specifically denies breast tenderness cough.  PLANNED TREATMENT REGIMEN: Right breast radiation  PAST MEDICAL HISTORY:  has a past medical history of Arthritis, Breast cancer ([place]) (01/2017), Chronic pain, COPD (chronic obstructive pulmonary disease) ([place]), GERD  (gastroesophageal reflux disease), Hyperlipidemia, Hypertension, Neuromuscular disorder ([place]), and Skin cancer.    PAST SURGICAL HISTORY: BACK SURGERY  . CESAREAN SECTION  . PR TOTAL HIP ARTHROPLASTY Left 05/09/2014  Procedure: ARTHROPLASTY, ACETABULAR & PROXIMAL FEMORAL PROSTHETIC REPLACEMENT (TOTAL HIP), W/WO AUTOGRAFT/ALLOGRAFT; Surgeon: Domingo Pulse, MD; Location: [place]; Service: Orthopedics  . SPINE SURGERY  lumbar and cervical       FAMILY HISTORY: family history is not on file.  SOCIAL HISTORY:  reports that she has been smoking cigarettes.  She has a 61.50 pack-year smoking history. she has never used smokeless tobacco. She reports that she drinks alcohol. She reports that she does not use drugs.  ALLERGIES: Codeine; Erythromycin base; and Hydromorphone  MEDICATIONS:  Current Outpatient Medications  Medication Sig Dispense Refill  . aspirin EC 81 MG tablet Take 81 mg by mouth daily.    Marland Kitchen buPROPion (WELLBUTRIN XL) 150 MG 24 hr tablet Take 150 mg by mouth daily.    . Calcium Carbonate (CALCIUM-CARB 600 PO) Take by mouth.    . DULoxetine (CYMBALTA) 60 MG capsule Take 60 mg by mouth daily.    Marland Kitchen gabapentin (NEURONTIN) 300 MG capsule Take 900 mg by mouth 3 (three) times daily.    . hydrochlorothiazide (HYDRODIURIL) 25 MG tablet Take 12.5 mg by mouth daily.    Marland Kitchen ibuprofen (ADVIL,MOTRIN) 600 MG tablet Take 600 mg by mouth every 8 (eight) hours as needed.    . lansoprazole (PREVACID) 30 MG capsule Take 30 mg by mouth daily at 12 noon.    . Melatonin 1 MG TABS Take 1 tablet by mouth at bedtime.    Marland Kitchen  ondansetron (ZOFRAN) 4 MG tablet Take 4 mg by mouth 2 (two) times daily.    . pravastatin (PRAVACHOL) 40 MG tablet Take 40 mg by mouth daily.    Marland Kitchen tolterodine (DETROL) 2 MG tablet Take 2 mg by mouth 2 (two) times daily.    . traZODone (DESYREL) 150 MG tablet Take by mouth at bedtime.    . vitamin C (ASCORBIC ACID) 500 MG tablet Take 500 mg by mouth daily.     No  current facility-administered medications for this encounter.     ECOG PERFORMANCE STATUS:  0 - Asymptomatic  REVIEW OF SYSTEMS: Except for the hip pain Patient denies any weight loss, fatigue, weakness, fever, chills or night sweats. Patient denies any loss of vision, blurred vision. Patient denies any ringing  of the ears or hearing loss. No irregular heartbeat. Patient denies heart murmur or history of fainting. Patient denies any chest pain or pain radiating to her upper extremities. Patient denies any shortness of breath, difficulty breathing at night, cough or hemoptysis. Patient denies any swelling in the lower legs. Patient denies any nausea vomiting, vomiting of blood, or coffee ground material in the vomitus. Patient denies any stomach pain. Patient states has had normal bowel movements no significant constipation or diarrhea. Patient denies any dysuria, hematuria or significant nocturia. Patient denies any problems walking, swelling in the joints or loss of balance. Patient denies any skin changes, loss of hair or loss of weight. Patient denies any excessive worrying or anxiety or significant depression. Patient denies any problems with insomnia. Patient denies excessive thirst, polyuria, polydipsia. Patient denies any swollen glands, patient denies easy bruising or easy bleeding. Patient denies any recent infections, allergies or URI. Patient "s visual fields have not changed significantly in recent time.    PHYSICAL EXAM: BP (!) 141/88   Pulse 89   Temp 97.8 F (36.6 C)   Resp 18   Ht 5\' 8"  (1.727 m)   Wt 227 lb 15.3 oz (103.4 kg)   BMI 34.66 kg/m  Well-developed obese female wheelchair-bound in NAD. Right breast is has a wide local excision site which is healing well. No dominant mass or nodularity is noted in either breast in 2 positions examined. No axillary or supraclavicular adenopathy is identified. Well-developed well-nourished patient in NAD. HEENT reveals PERLA, EOMI, discs  not visualized.  Oral cavity is clear. No oral mucosal lesions are identified. Neck is clear without evidence of cervical or supraclavicular adenopathy. Lungs are clear to A&P. Cardiac examination is essentially unremarkable with regular rate and rhythm without murmur rub or thrill. Abdomen is benign with no organomegaly or masses noted. Motor sensory and DTR levels are equal and symmetric in the upper and lower extremities. Cranial nerves II through XII are grossly intact. Proprioception is intact. No peripheral adenopathy or edema is identified. No motor or sensory levels are noted. Crude visual fields are within normal range.  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 She has a large pendulous breasts making hypofractionated course of treatment difficult. I would recommend 5040 cGy in 28 fractions. Also boost her scar another 1600 cGy using electron beam. Risks and benefits of treatment include a skin reaction fatigue alteration of blood counts possible inclusion of  superficial lung all were discussed in detail with the patient. I have personally set up and ordered CT simulation for later this week. Patient also will be a candidate for possible tamoxifen therapy after completion of radiation. Patient and her husband both seem to comprehend my treatment plan well.  I would like to take this opportunity to thank you for allowing me to participate in the care of your patient.Armstead Peaks., MD

## 2017-02-18 ENCOUNTER — Ambulatory Visit
Admission: RE | Admit: 2017-02-18 | Discharge: 2017-02-18 | Disposition: A | Discharge status: Home / Self Care | Payer: Medicare HMO | Source: Ambulatory Visit | Attending: Radiation Oncology | Admitting: Radiation Oncology

## 2017-02-18 DIAGNOSIS — Z510 Encounter for antineoplastic radiation therapy: Secondary | ICD-10-CM | POA: Diagnosis not present

## 2017-02-20 DIAGNOSIS — Z510 Encounter for antineoplastic radiation therapy: Secondary | ICD-10-CM | POA: Diagnosis not present

## 2017-02-26 ENCOUNTER — Ambulatory Visit
Admission: RE | Admit: 2017-02-26 | Discharge: 2017-02-26 | Disposition: A | Discharge status: Home / Self Care | Payer: Medicare HMO | Source: Ambulatory Visit | Attending: Radiation Oncology | Admitting: Radiation Oncology

## 2017-02-26 DIAGNOSIS — Z510 Encounter for antineoplastic radiation therapy: Secondary | ICD-10-CM | POA: Diagnosis not present

## 2017-02-27 ENCOUNTER — Other Ambulatory Visit: Payer: Self-pay | Admitting: *Deleted

## 2017-02-27 DIAGNOSIS — D0511 Intraductal carcinoma in situ of right breast: Secondary | ICD-10-CM

## 2017-03-03 ENCOUNTER — Ambulatory Visit: Payer: Medicare HMO

## 2017-03-03 ENCOUNTER — Ambulatory Visit
Admission: RE | Admit: 2017-03-03 | Discharge: 2017-03-03 | Disposition: A | Discharge status: Home / Self Care | Payer: Medicare HMO | Source: Ambulatory Visit | Attending: Radiation Oncology | Admitting: Radiation Oncology

## 2017-03-03 DIAGNOSIS — Z510 Encounter for antineoplastic radiation therapy: Secondary | ICD-10-CM | POA: Diagnosis not present

## 2017-03-04 ENCOUNTER — Ambulatory Visit: Payer: Medicare HMO

## 2017-03-04 ENCOUNTER — Ambulatory Visit
Admission: RE | Admit: 2017-03-04 | Discharge: 2017-03-04 | Disposition: A | Discharge status: Home / Self Care | Payer: Medicare HMO | Source: Ambulatory Visit | Attending: Radiation Oncology | Admitting: Radiation Oncology

## 2017-03-04 DIAGNOSIS — Z510 Encounter for antineoplastic radiation therapy: Secondary | ICD-10-CM | POA: Diagnosis not present

## 2017-03-05 ENCOUNTER — Ambulatory Visit
Admission: RE | Admit: 2017-03-05 | Discharge: 2017-03-05 | Disposition: A | Discharge status: Home / Self Care | Payer: Medicare HMO | Source: Ambulatory Visit | Attending: Radiation Oncology | Admitting: Radiation Oncology

## 2017-03-05 ENCOUNTER — Ambulatory Visit: Payer: Medicare HMO

## 2017-03-05 DIAGNOSIS — Z510 Encounter for antineoplastic radiation therapy: Secondary | ICD-10-CM | POA: Diagnosis not present

## 2017-03-06 ENCOUNTER — Ambulatory Visit: Payer: Medicare HMO

## 2017-03-06 ENCOUNTER — Ambulatory Visit
Admission: RE | Admit: 2017-03-06 | Discharge: 2017-03-06 | Disposition: A | Discharge status: Home / Self Care | Payer: Medicare HMO | Source: Ambulatory Visit | Attending: Radiation Oncology | Admitting: Radiation Oncology

## 2017-03-06 DIAGNOSIS — Z510 Encounter for antineoplastic radiation therapy: Secondary | ICD-10-CM | POA: Diagnosis not present

## 2017-03-09 ENCOUNTER — Ambulatory Visit: Payer: Medicare HMO

## 2017-03-11 ENCOUNTER — Ambulatory Visit: Payer: Medicare HMO

## 2017-03-11 ENCOUNTER — Ambulatory Visit
Admission: RE | Admit: 2017-03-11 | Discharge: 2017-03-11 | Disposition: A | Discharge status: Home / Self Care | Payer: Medicare HMO | Source: Ambulatory Visit | Attending: Radiation Oncology | Admitting: Radiation Oncology

## 2017-03-11 DIAGNOSIS — Z510 Encounter for antineoplastic radiation therapy: Secondary | ICD-10-CM | POA: Diagnosis not present

## 2017-03-12 ENCOUNTER — Ambulatory Visit
Admission: RE | Admit: 2017-03-12 | Discharge: 2017-03-12 | Disposition: A | Discharge status: Home / Self Care | Payer: Medicare HMO | Source: Ambulatory Visit | Attending: Radiation Oncology | Admitting: Radiation Oncology

## 2017-03-12 ENCOUNTER — Ambulatory Visit: Payer: Medicare HMO

## 2017-03-12 DIAGNOSIS — Z510 Encounter for antineoplastic radiation therapy: Secondary | ICD-10-CM | POA: Diagnosis not present

## 2017-03-13 ENCOUNTER — Ambulatory Visit: Payer: Medicare HMO

## 2017-03-13 ENCOUNTER — Ambulatory Visit
Admission: RE | Admit: 2017-03-13 | Discharge: 2017-03-13 | Disposition: A | Discharge status: Home / Self Care | Payer: Medicare HMO | Source: Ambulatory Visit | Attending: Radiation Oncology | Admitting: Radiation Oncology

## 2017-03-13 DIAGNOSIS — Z510 Encounter for antineoplastic radiation therapy: Secondary | ICD-10-CM | POA: Diagnosis not present

## 2017-03-16 ENCOUNTER — Ambulatory Visit: Payer: Medicare HMO

## 2017-03-16 ENCOUNTER — Ambulatory Visit
Admission: RE | Admit: 2017-03-16 | Discharge: 2017-03-16 | Disposition: A | Discharge status: Home / Self Care | Payer: Medicare HMO | Source: Ambulatory Visit | Attending: Radiation Oncology | Admitting: Radiation Oncology

## 2017-03-16 DIAGNOSIS — Z510 Encounter for antineoplastic radiation therapy: Secondary | ICD-10-CM | POA: Diagnosis not present

## 2017-03-18 ENCOUNTER — Ambulatory Visit: Payer: Medicare HMO

## 2017-03-18 ENCOUNTER — Inpatient Hospital Stay: Payer: Medicare HMO | Attending: Radiation Oncology

## 2017-03-18 DIAGNOSIS — Z170 Estrogen receptor positive status [ER+]: Secondary | ICD-10-CM | POA: Insufficient documentation

## 2017-03-18 DIAGNOSIS — D0591 Unspecified type of carcinoma in situ of right breast: Secondary | ICD-10-CM | POA: Insufficient documentation

## 2017-03-19 ENCOUNTER — Ambulatory Visit
Admission: RE | Admit: 2017-03-19 | Discharge: 2017-03-19 | Disposition: A | Discharge status: Home / Self Care | Payer: Medicare HMO | Source: Ambulatory Visit | Attending: Radiation Oncology | Admitting: Radiation Oncology

## 2017-03-19 ENCOUNTER — Ambulatory Visit: Payer: Medicare HMO

## 2017-03-19 DIAGNOSIS — Z510 Encounter for antineoplastic radiation therapy: Secondary | ICD-10-CM | POA: Diagnosis not present

## 2017-03-20 ENCOUNTER — Ambulatory Visit: Payer: Medicare HMO

## 2017-03-20 ENCOUNTER — Ambulatory Visit
Admission: RE | Admit: 2017-03-20 | Discharge: 2017-03-20 | Disposition: A | Discharge status: Home / Self Care | Payer: Medicare HMO | Source: Ambulatory Visit | Attending: Radiation Oncology | Admitting: Radiation Oncology

## 2017-03-20 DIAGNOSIS — Z510 Encounter for antineoplastic radiation therapy: Secondary | ICD-10-CM | POA: Diagnosis not present

## 2017-03-23 ENCOUNTER — Ambulatory Visit: Payer: Medicare HMO

## 2017-03-23 ENCOUNTER — Ambulatory Visit
Admission: RE | Admit: 2017-03-23 | Discharge: 2017-03-23 | Disposition: A | Discharge status: Home / Self Care | Payer: Medicare HMO | Source: Ambulatory Visit | Attending: Radiation Oncology | Admitting: Radiation Oncology

## 2017-03-23 DIAGNOSIS — Z510 Encounter for antineoplastic radiation therapy: Secondary | ICD-10-CM | POA: Diagnosis not present

## 2017-03-24 ENCOUNTER — Ambulatory Visit
Admission: RE | Admit: 2017-03-24 | Discharge: 2017-03-24 | Disposition: A | Discharge status: Home / Self Care | Payer: Medicare HMO | Source: Ambulatory Visit | Attending: Radiation Oncology | Admitting: Radiation Oncology

## 2017-03-24 ENCOUNTER — Ambulatory Visit: Payer: Medicare HMO

## 2017-03-24 DIAGNOSIS — Z510 Encounter for antineoplastic radiation therapy: Secondary | ICD-10-CM | POA: Diagnosis not present

## 2017-03-25 ENCOUNTER — Ambulatory Visit: Payer: Medicare HMO

## 2017-03-25 ENCOUNTER — Ambulatory Visit
Admission: RE | Admit: 2017-03-25 | Discharge: 2017-03-25 | Disposition: A | Discharge status: Home / Self Care | Payer: Medicare HMO | Source: Ambulatory Visit | Attending: Radiation Oncology | Admitting: Radiation Oncology

## 2017-03-25 DIAGNOSIS — Z510 Encounter for antineoplastic radiation therapy: Secondary | ICD-10-CM | POA: Diagnosis not present

## 2017-03-26 ENCOUNTER — Ambulatory Visit: Payer: Medicare HMO

## 2017-03-27 ENCOUNTER — Ambulatory Visit: Payer: Medicare HMO

## 2017-03-27 ENCOUNTER — Ambulatory Visit
Admission: RE | Admit: 2017-03-27 | Discharge: 2017-03-27 | Disposition: A | Discharge status: Home / Self Care | Payer: Medicare HMO | Source: Ambulatory Visit | Attending: Radiation Oncology | Admitting: Radiation Oncology

## 2017-03-27 DIAGNOSIS — Z510 Encounter for antineoplastic radiation therapy: Secondary | ICD-10-CM | POA: Diagnosis not present

## 2017-03-30 ENCOUNTER — Ambulatory Visit: Payer: Medicare HMO

## 2017-03-30 ENCOUNTER — Ambulatory Visit
Admission: RE | Admit: 2017-03-30 | Discharge: 2017-03-30 | Disposition: A | Discharge status: Home / Self Care | Payer: Medicare HMO | Source: Ambulatory Visit | Attending: Radiation Oncology | Admitting: Radiation Oncology

## 2017-03-30 DIAGNOSIS — Z510 Encounter for antineoplastic radiation therapy: Secondary | ICD-10-CM | POA: Diagnosis not present

## 2017-03-31 ENCOUNTER — Ambulatory Visit: Payer: Medicare HMO

## 2017-03-31 ENCOUNTER — Ambulatory Visit
Admission: RE | Admit: 2017-03-31 | Discharge: 2017-03-31 | Disposition: A | Discharge status: Home / Self Care | Payer: Medicare HMO | Source: Ambulatory Visit | Attending: Radiation Oncology | Admitting: Radiation Oncology

## 2017-03-31 ENCOUNTER — Other Ambulatory Visit: Payer: Self-pay | Admitting: *Deleted

## 2017-03-31 DIAGNOSIS — Z510 Encounter for antineoplastic radiation therapy: Secondary | ICD-10-CM | POA: Diagnosis not present

## 2017-03-31 MED ORDER — SILVER SULFADIAZINE 1 % EX CREA: 1.0000 "application " | TOPICAL_CREAM | Freq: Two times a day (BID) | CUTANEOUS | 4 refills | Status: DC

## 2017-04-01 ENCOUNTER — Ambulatory Visit: Payer: Medicare HMO

## 2017-04-01 ENCOUNTER — Inpatient Hospital Stay: Payer: Medicare HMO

## 2017-04-02 ENCOUNTER — Inpatient Hospital Stay: Payer: Medicare HMO

## 2017-04-02 ENCOUNTER — Ambulatory Visit: Payer: Medicare HMO

## 2017-04-02 ENCOUNTER — Ambulatory Visit: Admission: RE | Admit: 2017-04-02 | Payer: Medicare HMO | Source: Ambulatory Visit

## 2017-04-02 DIAGNOSIS — D0591 Unspecified type of carcinoma in situ of right breast: Secondary | ICD-10-CM | POA: Diagnosis present

## 2017-04-02 DIAGNOSIS — D0511 Intraductal carcinoma in situ of right breast: Secondary | ICD-10-CM

## 2017-04-02 DIAGNOSIS — Z170 Estrogen receptor positive status [ER+]: Secondary | ICD-10-CM | POA: Diagnosis not present

## 2017-04-02 LAB — CBC
HEMATOCRIT: 40.1 % (ref 35.0–47.0)
HEMOGLOBIN: 14.1 g/dL (ref 12.0–16.0)
MCH: 35.9 pg — AB (ref 26.0–34.0)
MCHC: 35.1 g/dL (ref 32.0–36.0)
MCV: 102.1 fL — ABNORMAL HIGH (ref 80.0–100.0)
Platelets: 330 10*3/uL (ref 150–440)
RBC: 3.92 MIL/uL (ref 3.80–5.20)
RDW: 12.8 % (ref 11.5–14.5)
WBC: 7.1 10*3/uL (ref 3.6–11.0)

## 2017-04-03 ENCOUNTER — Ambulatory Visit: Payer: Medicare HMO

## 2017-04-06 ENCOUNTER — Ambulatory Visit: Payer: Medicare HMO

## 2017-04-07 ENCOUNTER — Ambulatory Visit: Payer: Medicare HMO

## 2017-04-08 ENCOUNTER — Ambulatory Visit: Payer: Medicare HMO

## 2017-04-08 ENCOUNTER — Ambulatory Visit: Admission: RE | Admit: 2017-04-08 | Payer: Medicare HMO | Source: Ambulatory Visit

## 2017-04-09 ENCOUNTER — Ambulatory Visit: Payer: Medicare HMO

## 2017-04-10 ENCOUNTER — Ambulatory Visit: Payer: Medicare HMO

## 2017-04-13 ENCOUNTER — Ambulatory Visit: Payer: Medicare HMO

## 2017-04-13 ENCOUNTER — Ambulatory Visit
Admission: RE | Admit: 2017-04-13 | Discharge: 2017-04-13 | Disposition: A | Discharge status: Home / Self Care | Payer: Medicare HMO | Source: Ambulatory Visit | Attending: Radiation Oncology | Admitting: Radiation Oncology

## 2017-04-13 DIAGNOSIS — Z510 Encounter for antineoplastic radiation therapy: Secondary | ICD-10-CM | POA: Diagnosis not present

## 2017-04-14 ENCOUNTER — Ambulatory Visit: Payer: Medicare HMO

## 2017-04-14 ENCOUNTER — Ambulatory Visit
Admission: RE | Admit: 2017-04-14 | Discharge: 2017-04-14 | Disposition: A | Discharge status: Home / Self Care | Payer: Medicare HMO | Source: Ambulatory Visit | Attending: Radiation Oncology | Admitting: Radiation Oncology

## 2017-04-14 DIAGNOSIS — Z510 Encounter for antineoplastic radiation therapy: Secondary | ICD-10-CM | POA: Diagnosis not present

## 2017-04-15 ENCOUNTER — Ambulatory Visit: Payer: Medicare HMO

## 2017-04-15 ENCOUNTER — Ambulatory Visit
Admission: RE | Admit: 2017-04-15 | Discharge: 2017-04-15 | Disposition: A | Discharge status: Home / Self Care | Payer: Medicare HMO | Source: Ambulatory Visit | Attending: Radiation Oncology | Admitting: Radiation Oncology

## 2017-04-15 ENCOUNTER — Inpatient Hospital Stay: Payer: Medicare HMO

## 2017-04-15 DIAGNOSIS — Z510 Encounter for antineoplastic radiation therapy: Secondary | ICD-10-CM | POA: Diagnosis not present

## 2017-04-15 DIAGNOSIS — D0591 Unspecified type of carcinoma in situ of right breast: Secondary | ICD-10-CM | POA: Diagnosis not present

## 2017-04-15 DIAGNOSIS — D0511 Intraductal carcinoma in situ of right breast: Secondary | ICD-10-CM

## 2017-04-15 LAB — CBC
HCT: 38.7 % (ref 35.0–47.0)
Hemoglobin: 13.5 g/dL (ref 12.0–16.0)
MCH: 35.6 pg — ABNORMAL HIGH (ref 26.0–34.0)
MCHC: 34.8 g/dL (ref 32.0–36.0)
MCV: 102.3 fL — AB (ref 80.0–100.0)
PLATELETS: 313 10*3/uL (ref 150–440)
RBC: 3.79 MIL/uL — AB (ref 3.80–5.20)
RDW: 13.1 % (ref 11.5–14.5)
WBC: 5.1 10*3/uL (ref 3.6–11.0)

## 2017-04-16 ENCOUNTER — Ambulatory Visit
Admission: RE | Admit: 2017-04-16 | Discharge: 2017-04-16 | Disposition: A | Discharge status: Home / Self Care | Payer: Medicare HMO | Source: Ambulatory Visit | Attending: Radiation Oncology | Admitting: Radiation Oncology

## 2017-04-16 ENCOUNTER — Ambulatory Visit: Payer: Medicare HMO

## 2017-04-16 DIAGNOSIS — Z510 Encounter for antineoplastic radiation therapy: Secondary | ICD-10-CM | POA: Diagnosis not present

## 2017-04-17 ENCOUNTER — Ambulatory Visit
Admission: RE | Admit: 2017-04-17 | Discharge: 2017-04-17 | Disposition: A | Discharge status: Home / Self Care | Payer: Medicare HMO | Source: Ambulatory Visit | Attending: Radiation Oncology | Admitting: Radiation Oncology

## 2017-04-17 ENCOUNTER — Ambulatory Visit: Payer: Medicare HMO

## 2017-04-17 DIAGNOSIS — Z510 Encounter for antineoplastic radiation therapy: Secondary | ICD-10-CM | POA: Insufficient documentation

## 2017-04-17 DIAGNOSIS — D0511 Intraductal carcinoma in situ of right breast: Secondary | ICD-10-CM | POA: Diagnosis not present

## 2017-04-17 DIAGNOSIS — Z170 Estrogen receptor positive status [ER+]: Secondary | ICD-10-CM | POA: Insufficient documentation

## 2017-04-20 ENCOUNTER — Ambulatory Visit
Admission: RE | Admit: 2017-04-20 | Discharge: 2017-04-20 | Disposition: A | Discharge status: Home / Self Care | Payer: Medicare HMO | Source: Ambulatory Visit | Attending: Radiation Oncology | Admitting: Radiation Oncology

## 2017-04-20 ENCOUNTER — Ambulatory Visit: Payer: Medicare HMO

## 2017-04-20 DIAGNOSIS — Z510 Encounter for antineoplastic radiation therapy: Secondary | ICD-10-CM | POA: Diagnosis not present

## 2017-04-21 ENCOUNTER — Ambulatory Visit: Payer: Medicare HMO

## 2017-04-21 DIAGNOSIS — Z510 Encounter for antineoplastic radiation therapy: Secondary | ICD-10-CM | POA: Diagnosis not present

## 2017-04-22 ENCOUNTER — Ambulatory Visit
Admission: RE | Admit: 2017-04-22 | Discharge: 2017-04-22 | Disposition: A | Discharge status: Home / Self Care | Payer: Medicare HMO | Source: Ambulatory Visit | Attending: Radiation Oncology | Admitting: Radiation Oncology

## 2017-04-22 ENCOUNTER — Ambulatory Visit: Payer: Medicare HMO

## 2017-04-22 DIAGNOSIS — Z510 Encounter for antineoplastic radiation therapy: Secondary | ICD-10-CM | POA: Diagnosis not present

## 2017-04-23 ENCOUNTER — Ambulatory Visit: Payer: Medicare HMO

## 2017-04-24 ENCOUNTER — Ambulatory Visit: Payer: Medicare HMO

## 2017-04-27 ENCOUNTER — Ambulatory Visit
Admission: RE | Admit: 2017-04-27 | Discharge: 2017-04-27 | Disposition: A | Discharge status: Home / Self Care | Payer: Medicare HMO | Source: Ambulatory Visit | Attending: Radiation Oncology | Admitting: Radiation Oncology

## 2017-04-27 ENCOUNTER — Ambulatory Visit: Payer: Medicare HMO

## 2017-04-27 DIAGNOSIS — Z510 Encounter for antineoplastic radiation therapy: Secondary | ICD-10-CM | POA: Diagnosis not present

## 2017-04-28 ENCOUNTER — Ambulatory Visit: Payer: Medicare HMO

## 2017-04-28 ENCOUNTER — Ambulatory Visit
Admission: RE | Admit: 2017-04-28 | Discharge: 2017-04-28 | Disposition: A | Discharge status: Home / Self Care | Payer: Medicare HMO | Source: Ambulatory Visit | Attending: Radiation Oncology | Admitting: Radiation Oncology

## 2017-04-28 DIAGNOSIS — Z510 Encounter for antineoplastic radiation therapy: Secondary | ICD-10-CM | POA: Diagnosis not present

## 2017-04-29 ENCOUNTER — Ambulatory Visit: Payer: Medicare HMO

## 2017-04-29 ENCOUNTER — Ambulatory Visit
Admission: RE | Admit: 2017-04-29 | Discharge: 2017-04-29 | Disposition: A | Discharge status: Home / Self Care | Payer: Medicare HMO | Source: Ambulatory Visit | Attending: Radiation Oncology | Admitting: Radiation Oncology

## 2017-04-29 DIAGNOSIS — Z510 Encounter for antineoplastic radiation therapy: Secondary | ICD-10-CM | POA: Diagnosis not present

## 2017-04-30 ENCOUNTER — Ambulatory Visit: Payer: Medicare HMO

## 2017-04-30 ENCOUNTER — Ambulatory Visit
Admission: RE | Admit: 2017-04-30 | Discharge: 2017-04-30 | Disposition: A | Discharge status: Home / Self Care | Payer: Medicare HMO | Source: Ambulatory Visit | Attending: Radiation Oncology | Admitting: Radiation Oncology

## 2017-04-30 DIAGNOSIS — Z510 Encounter for antineoplastic radiation therapy: Secondary | ICD-10-CM | POA: Diagnosis not present

## 2017-05-01 ENCOUNTER — Ambulatory Visit: Payer: Medicare HMO

## 2017-05-01 ENCOUNTER — Ambulatory Visit
Admission: RE | Admit: 2017-05-01 | Discharge: 2017-05-01 | Disposition: A | Discharge status: Home / Self Care | Payer: Medicare HMO | Source: Ambulatory Visit | Attending: Radiation Oncology | Admitting: Radiation Oncology

## 2017-05-01 DIAGNOSIS — Z510 Encounter for antineoplastic radiation therapy: Secondary | ICD-10-CM | POA: Diagnosis not present

## 2017-05-04 ENCOUNTER — Ambulatory Visit
Admission: RE | Admit: 2017-05-04 | Discharge: 2017-05-04 | Disposition: A | Discharge status: Home / Self Care | Payer: Medicare HMO | Source: Ambulatory Visit | Attending: Radiation Oncology | Admitting: Radiation Oncology

## 2017-05-04 DIAGNOSIS — Z510 Encounter for antineoplastic radiation therapy: Secondary | ICD-10-CM | POA: Diagnosis not present

## 2017-05-05 ENCOUNTER — Ambulatory Visit: Payer: Medicare HMO

## 2017-05-05 ENCOUNTER — Ambulatory Visit
Admission: RE | Admit: 2017-05-05 | Discharge: 2017-05-05 | Disposition: A | Discharge status: Home / Self Care | Payer: Medicare HMO | Source: Ambulatory Visit | Attending: Radiation Oncology | Admitting: Radiation Oncology

## 2017-05-05 DIAGNOSIS — Z510 Encounter for antineoplastic radiation therapy: Secondary | ICD-10-CM | POA: Diagnosis not present

## 2017-05-06 ENCOUNTER — Ambulatory Visit
Admission: RE | Admit: 2017-05-06 | Discharge: 2017-05-06 | Disposition: A | Discharge status: Home / Self Care | Payer: Medicare HMO | Source: Ambulatory Visit | Attending: Radiation Oncology | Admitting: Radiation Oncology

## 2017-05-06 DIAGNOSIS — Z510 Encounter for antineoplastic radiation therapy: Secondary | ICD-10-CM | POA: Diagnosis not present

## 2017-05-07 ENCOUNTER — Ambulatory Visit
Admission: RE | Admit: 2017-05-07 | Discharge: 2017-05-07 | Disposition: A | Discharge status: Home / Self Care | Payer: Medicare HMO | Source: Ambulatory Visit | Attending: Radiation Oncology | Admitting: Radiation Oncology

## 2017-05-07 DIAGNOSIS — Z510 Encounter for antineoplastic radiation therapy: Secondary | ICD-10-CM | POA: Diagnosis not present

## 2017-05-08 ENCOUNTER — Ambulatory Visit
Admission: RE | Admit: 2017-05-08 | Discharge: 2017-05-08 | Disposition: A | Discharge status: Home / Self Care | Payer: Medicare HMO | Source: Ambulatory Visit | Attending: Radiation Oncology | Admitting: Radiation Oncology

## 2017-05-08 DIAGNOSIS — Z510 Encounter for antineoplastic radiation therapy: Secondary | ICD-10-CM | POA: Diagnosis not present

## 2017-06-08 ENCOUNTER — Ambulatory Visit: Payer: Medicare HMO | Admitting: Radiation Oncology

## 2017-07-06 ENCOUNTER — Emergency Department
Admission: EM | Admit: 2017-07-06 | Discharge: 2017-07-06 | Disposition: A | Discharge status: Home / Self Care | Payer: Medicare HMO | Attending: Student in an Organized Health Care Education/Training Program | Admitting: Student in an Organized Health Care Education/Training Program

## 2017-07-06 ENCOUNTER — Encounter: Payer: Self-pay | Admitting: Emergency Medicine

## 2017-07-06 ENCOUNTER — Emergency Department: Payer: Medicare HMO

## 2017-07-06 DIAGNOSIS — F17210 Nicotine dependence, cigarettes, uncomplicated: Secondary | ICD-10-CM | POA: Diagnosis not present

## 2017-07-06 DIAGNOSIS — I10 Essential (primary) hypertension: Secondary | ICD-10-CM | POA: Insufficient documentation

## 2017-07-06 DIAGNOSIS — Z85828 Personal history of other malignant neoplasm of skin: Secondary | ICD-10-CM | POA: Diagnosis not present

## 2017-07-06 DIAGNOSIS — Z79899 Other long term (current) drug therapy: Secondary | ICD-10-CM | POA: Insufficient documentation

## 2017-07-06 DIAGNOSIS — R229 Localized swelling, mass and lump, unspecified: Secondary | ICD-10-CM

## 2017-07-06 DIAGNOSIS — IMO0002 Reserved for concepts with insufficient information to code with codable children: Secondary | ICD-10-CM

## 2017-07-06 DIAGNOSIS — J449 Chronic obstructive pulmonary disease, unspecified: Secondary | ICD-10-CM | POA: Diagnosis not present

## 2017-07-06 DIAGNOSIS — Z853 Personal history of malignant neoplasm of breast: Secondary | ICD-10-CM | POA: Insufficient documentation

## 2017-07-06 DIAGNOSIS — Z7982 Long term (current) use of aspirin: Secondary | ICD-10-CM | POA: Diagnosis not present

## 2017-07-06 DIAGNOSIS — M25532 Pain in left wrist: Secondary | ICD-10-CM | POA: Diagnosis present

## 2017-07-06 DIAGNOSIS — M67432 Ganglion, left wrist: Secondary | ICD-10-CM

## 2017-07-06 NOTE — ED Provider Notes (Signed)
[facility] Emergency Department Provider Note  ____________________________________________  Time seen: Approximately 4:18 PM  I have reviewed the triage vital signs and the nursing notes.   HISTORY  Chief Complaint Wrist Pain    HPI Tara Norris is a 57 y.o. female that presents emergency department for evaluation of left wrist pain after falling twice in the last 2 weeks.  Patient states that she needs a hip replacement, which causes her to fall.  She landed on her left wrist when she fell 1 week ago.  2 days later, overnight, a mass developed on the outside of her wrist. Mass seems to change in size the more she uses her wrist but overall has not changed in size since it developed about 5 days ago. She has had some on and off tingling in her palms. She does not take any blood thinners. No additional injuries. She has an appointment with her orthopedist in one week.   Past Medical History:  Diagnosis Date  . Arthritis   . 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 Chronic pain    Right hip and leg  . COPD (chronic obstructive pulmonary disease) ([place])   . GERD (gastroesophageal reflux disease)   . Hyperlipidemia   . Hypertension   . Neuromuscular disorder ([place])   . Skin cancer     There are no active problems to display for this patient.    Prior to Admission medications   Medication Sig Start Date End Date Taking? Authorizing Provider  aspirin EC 81 MG tablet Take 81 mg by mouth daily.    [provider]  buPROPion (WELLBUTRIN XL) 150 MG 24 hr tablet Take 150 mg by mouth daily.    [provider]  Calcium Carbonate (CALCIUM-CARB 600 PO) Take by mouth.    [provider]  DULoxetine (CYMBALTA) 60 MG capsule Take 60 mg by mouth daily.    [provider]  gabapentin (NEURONTIN) 300 MG capsule Take 900 mg by mouth 3 (three) times daily.    [provider]  hydrochlorothiazide (HYDRODIURIL) 25 MG  tablet Take 12.5 mg by mouth daily.    [provider]  ibuprofen (ADVIL,MOTRIN) 600 MG tablet Take 600 mg by mouth every 8 (eight) hours as needed.    [provider]  lansoprazole (PREVACID) 30 MG capsule Take 30 mg by mouth daily at 12 noon.    [provider]  Melatonin 1 MG TABS Take 1 tablet by mouth at bedtime.    [provider]  ondansetron (ZOFRAN) 4 MG tablet Take 4 mg by mouth 2 (two) times daily.    [provider]  pravastatin (PRAVACHOL) 40 MG tablet Take 40 mg by mouth daily.    [provider]  silver sulfADIAZINE (SILVADENE) 1 % cream Apply 1 application topically 2 (two) times daily. 03/31/17   Noreene Filbert, MD  tolterodine (DETROL) 2 MG tablet Take 2 mg by mouth 2 (two) times daily.    [provider]  traZODone (DESYREL) 150 MG tablet Take by mouth at bedtime.    [provider]  vitamin C (ASCORBIC ACID) 500 MG tablet Take 500 mg by mouth daily.    [provider]    Allergies Codeine; Erythromycin base; and Hydromorphone  No family history on file.  Social History Social History   Tobacco Use  . Smoking status: Current Every Day Smoker    Packs/day: 1.50    Years: 41.00  Pack years: 61.50    Types: Cigarettes  . Smokeless tobacco: Never Used  Substance Use Topics  . Alcohol use: Yes    Frequency: Never    Comment: occasional  . Drug use: No     Review of Systems  Constitutional: No fever/chills Gastrointestinal: No abdominal pain.  No nausea, no vomiting.  Musculoskeletal: Positive for wrist pain.  Skin: Negative for rash, abrasions, lacerations, ecchymosis. Neurological: Positive for numbness or tingling   ____________________________________________   PHYSICAL EXAM:  VITAL SIGNS: ED Triage Vitals  Enc Vitals Group     BP 07/06/17 1512 132/78     Pulse Rate 07/06/17 1511 89     Resp 07/06/17 1511 20     Temp 07/06/17 1511 98 F (36.7 C)     Temp Source  07/06/17 1511 Oral     SpO2 07/06/17 1511 97 %     Weight 07/06/17 1511 220 lb (99.8 kg)     Height 07/06/17 1511 5\' 8"  (1.727 m)     Head Circumference --      Peak Flow --      Pain Score 07/06/17 1511 3     Pain Loc --      Pain Edu? --      Excl. in [place]? --      Constitutional: Alert and oriented. Well appearing and in no acute distress. Eyes: Conjunctivae are normal. PERRL. EOMI. Head: Atraumatic. ENT:      Ears:      Nose: No congestion/rhinnorhea.      Mouth/Throat: Mucous membranes are moist.  Neck: No stridor.   Cardiovascular: Normal rate, regular rhythm.  Good peripheral circulation. Symmetric radial pulses bilaterally  Respiratory: Normal respiratory effort without tachypnea or retractions. Lungs CTAB. Good air entry to the bases with no decreased or absent breath sounds. Musculoskeletal: Full range of motion to all extremities. No gross deformities appreciated.  3cm x 4cm area of swelling to ulnar side of left wrist. Minimally tender to palpation. No overlying erythema or ecchymosis. Equal strength in wrists and hands bilaterally. Full ROM of wrist. Neurologic:  Normal speech and language. No gross focal neurologic deficits are appreciated.  Skin:  Skin is warm, dry and intact. No rash noted. Psychiatric: Mood and affect are normal. Speech and behavior are normal. Patient exhibits appropriate insight and judgement.   ____________________________________________   LABS (all labs ordered are listed, but only abnormal results are displayed)  Labs Reviewed - No data to display ____________________________________________  EKG   ____________________________________________  RADIOLOGY Robinette Haines, personally viewed and evaluated these images (plain radiographs) as part of my medical decision making, as well as reviewing the written report by the radiologist.  Dg Forearm Left  Result Date: 07/06/2017 CLINICAL DATA:  Pain following fall EXAM: LEFT FOREARM - 2  VIEW COMPARISON:  None. FINDINGS: Frontal and lateral views were obtained. There is marked soft tissue prominence medial and volar to the distal ulna. No fracture or dislocation is demonstrated. There is narrowing of the radiocarpal joint. No elbow joint effusion. IMPRESSION: Marked soft tissue fullness volar and medial to the distal ulna. This soft tissue prominence measures approximately 3 x 4 cm. Question hematoma versus mass in this area based on radiographic appearance. No fracture or dislocation is evident. No erosive change or bony destruction. There is mild narrowing of the radiocarpal joint. Electronically Signed   By: Lowella Grip III M.D.   On: 07/06/2017 15:44   [place] Lt Upper Extrem Ltd Soft Tissue Non  Vascular  Result Date: 07/06/2017 CLINICAL DATA:  Left upper extremity mass for 2 weeks. Fall, striking this region. EXAM: ULTRASOUND left UPPER EXTREMITY LIMITED TECHNIQUE: Ultrasound examination of the upper extremity soft tissues was performed in the area of clinical concern. COMPARISON:  None FINDINGS: In the area of concern along the left wrist a complex fluid collection with a faint internal septation measures 5.4 by 4.2 by 3.7 cm (volume = 44 cm^3). No solid lesion is identified. No internal color flow. IMPRESSION: 1. Mildly complex cystic lesion corresponding to the region of concern, appearance favors ganglion cyst, less likely to be resolving hematoma. Electronically Signed   By: Van Clines M.D.   On: 07/06/2017 17:31    ____________________________________________    PROCEDURES  Procedure(s) performed:    Procedures    Medications - No data to display   ____________________________________________   INITIAL IMPRESSION / ASSESSMENT AND PLAN / ED COURSE  Pertinent labs & imaging results that were available during my care of the patient were reviewed by me and considered in my medical decision making (see chart for details).  Review of the [place] CSRS was  performed in accordance of the [place] prior to dispensing any controlled drugs.  Patient's diagnosis is consistent with ganglion cyst. Vital signs and exam are reassuring. Xray negative for fracture. Ultrasound and exam most consistent with ganglion cyst. Patient has an appointment with orthopedics in one week for hip. She will also be given a referral to hand. Patient is to follow up with orthopedics as directed. Patient is given ED precautions to return to the ED for any worsening or new symptoms.     ____________________________________________  FINAL CLINICAL IMPRESSION(S) / ED DIAGNOSES  Final diagnoses:  Mass  Ganglion of left wrist      NEW MEDICATIONS STARTED DURING THIS VISIT:  ED Discharge Orders    None          This chart was dictated using voice recognition software/Dragon. Despite best efforts to proofread, errors can occur which can change the meaning. Any change was purely unintentional.    Laban Emperor, PA-C 07/07/17 1014    Merlyn Lot, MD 07/07/17 [id number]

## 2017-07-06 NOTE — ED Triage Notes (Signed)
Pt reports falling 2 weeks ago and catching herself with left wrist and then falling 1 week ago and doing the same thing. Swelling noted to left wrist.

## 2020-02-06 IMAGING — DX DG FOREARM 2V*L*
2 series · 2 of 2 positions shown · non-contrast
Comparison: None.

CLINICAL DATA: Pain following fall

EXAM:
LEFT FOREARM - 2 VIEW

[forearm ap]
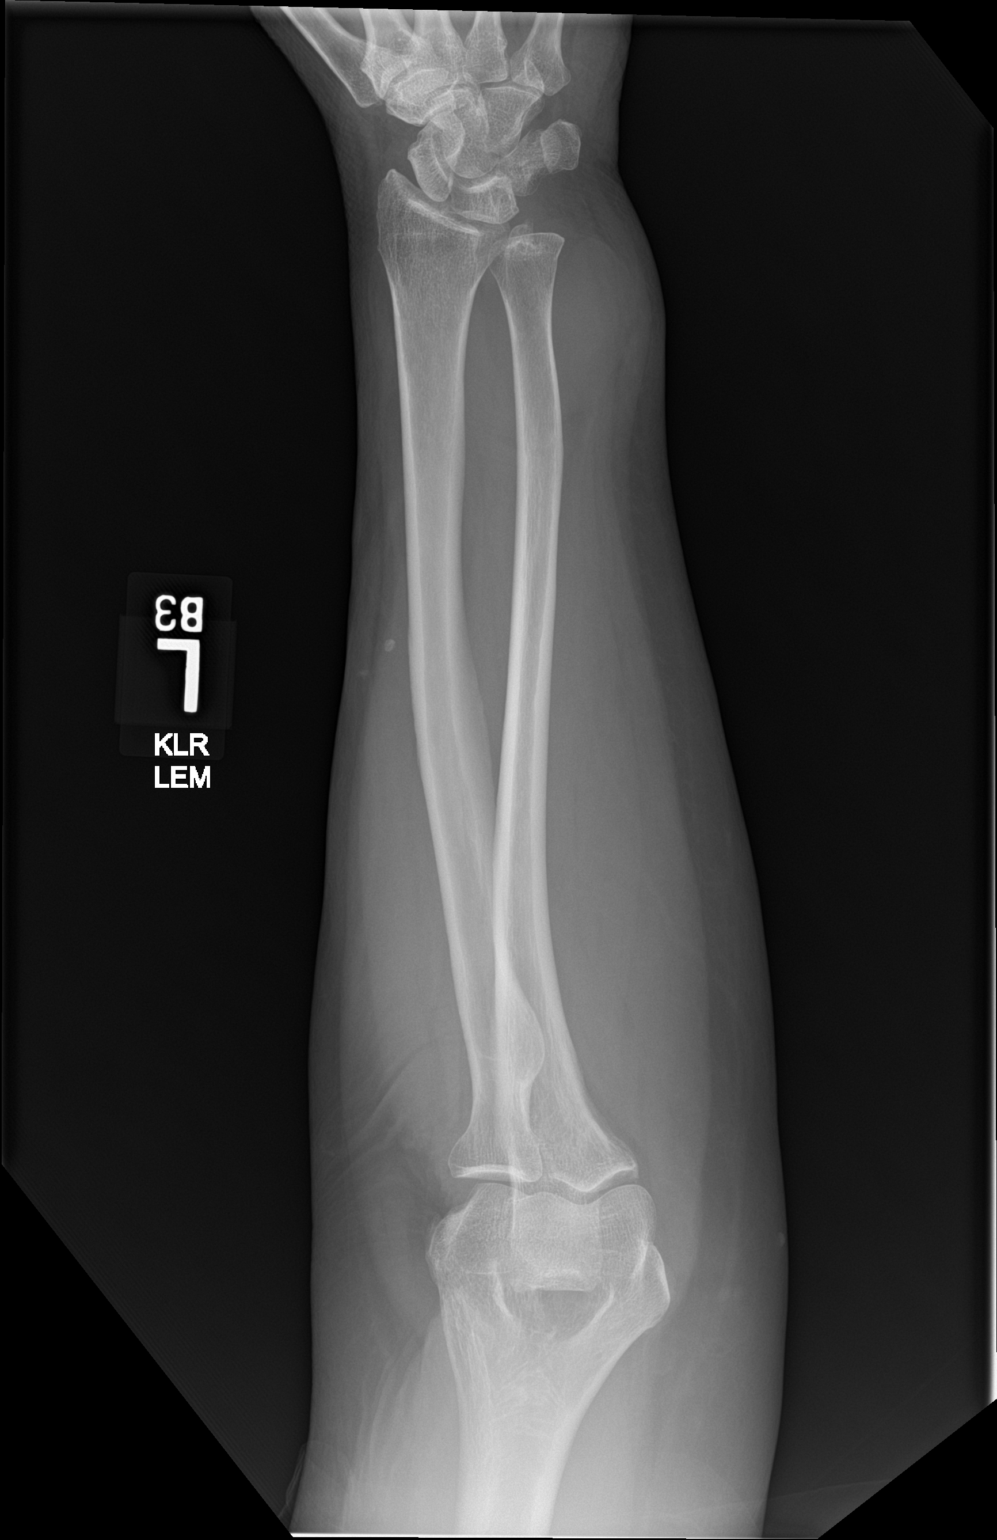

[forearm lat]
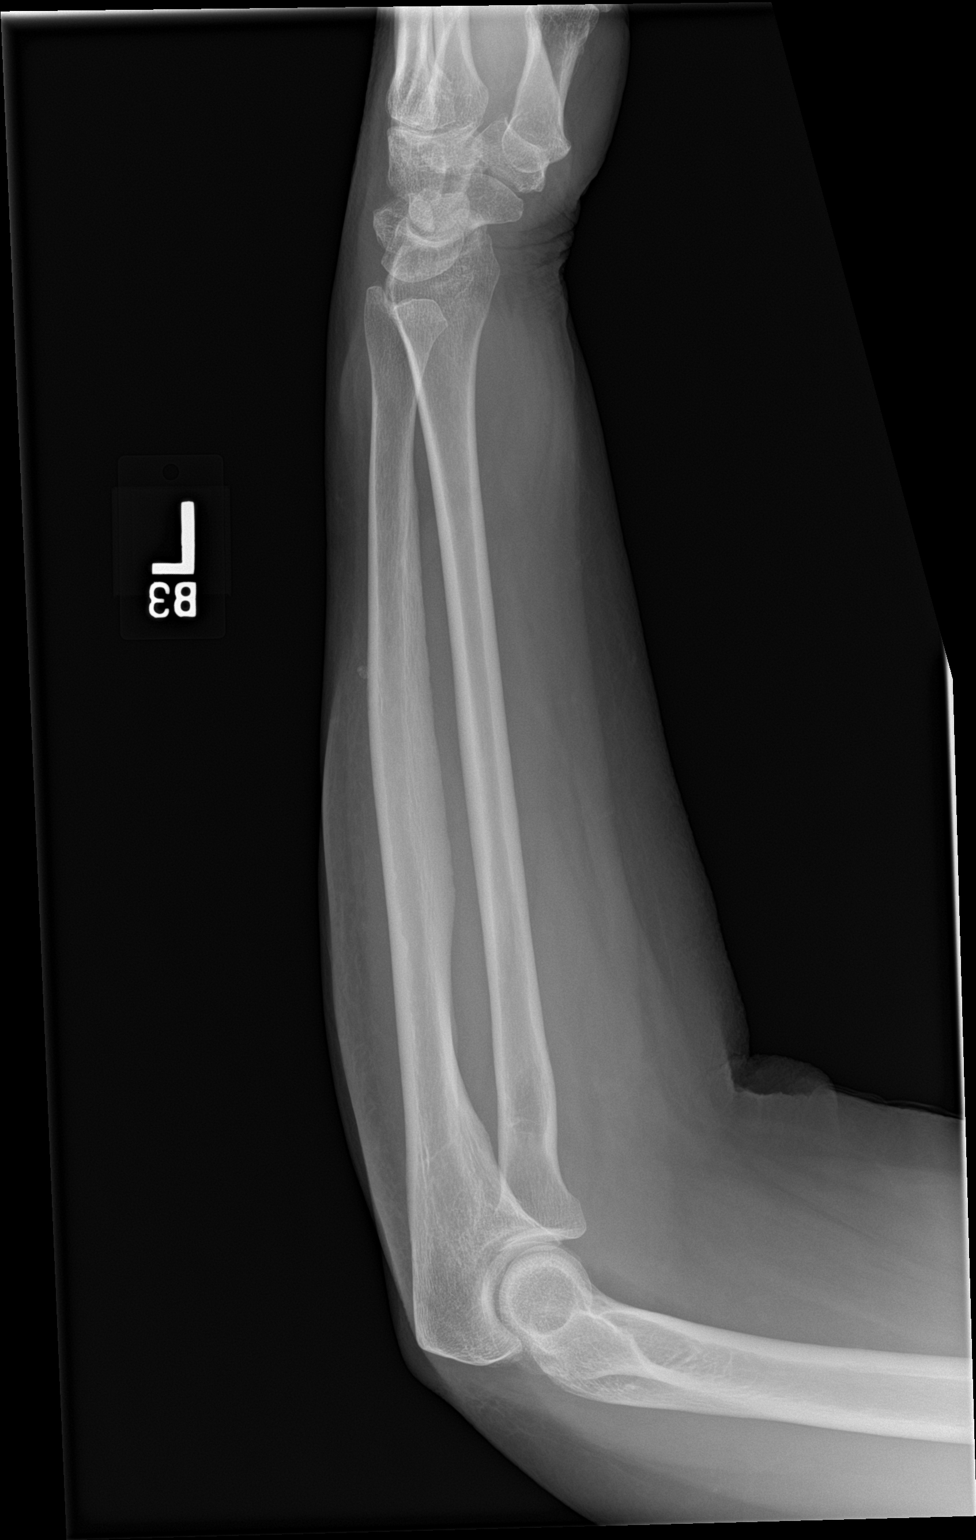

[2 of 2 positions shown; findings below may reference images not displayed]

FINDINGS: Frontal and lateral views were obtained. There is marked soft tissue
prominence medial and volar to the distal ulna. No fracture or
dislocation is demonstrated. There is narrowing of the radiocarpal
joint. No elbow joint effusion.
IMPRESSION: Marked soft tissue fullness volar and medial to the distal ulna.
This soft tissue prominence measures approximately 3 x 4 cm.
Question hematoma versus mass in this area based on radiographic
appearance. No fracture or dislocation is evident. No erosive change
or bony destruction. There is mild narrowing of the radiocarpal
joint.

## 2021-08-23 ENCOUNTER — Other Ambulatory Visit: Payer: Self-pay | Admitting: *Deleted

## 2021-08-23 ENCOUNTER — Other Ambulatory Visit: Payer: Self-pay | Admitting: Nurse Practitioner

## 2021-08-23 ENCOUNTER — Ambulatory Visit (INDEPENDENT_AMBULATORY_CARE_PROVIDER_SITE_OTHER): Payer: Medicare (Managed Care) | Admitting: Nurse Practitioner

## 2021-08-23 VITALS — BP 136/90 | HR 86 | Wt 244.6 lb

## 2021-08-23 DIAGNOSIS — Z7689 Persons encountering health services in other specified circumstances: Secondary | ICD-10-CM

## 2021-08-23 DIAGNOSIS — R42 Dizziness and giddiness: Secondary | ICD-10-CM | POA: Diagnosis not present

## 2021-08-23 DIAGNOSIS — F17200 Nicotine dependence, unspecified, uncomplicated: Secondary | ICD-10-CM | POA: Diagnosis not present

## 2021-08-23 DIAGNOSIS — I10 Essential (primary) hypertension: Secondary | ICD-10-CM | POA: Diagnosis not present

## 2021-08-23 NOTE — Progress Notes (Signed)
New Patient Office Visit  Subjective    Patient ID: Tara Norris, female    DOB: September 08, 1960  Age: 61 y.o. MRN: [id number]  CC: No chief complaint on file.   HPI Tara Norris presents to establish care.  Patient's previous PCP [facility].  Patient has history of arthritis, chronic pain,COPD, hypertension, GERD and anxiety.  Patient today also complaint of dizziness off-and-on  going on from few days.  Denies visual disturbances, headache, shortness of breath or chest pain..   Outpatient Encounter Medications as of 08/23/2021  Medication Sig   aspirin EC 81 MG tablet Take 81 mg by mouth daily.   Calcium Carbonate (CALCIUM-CARB 600 PO) Take by mouth.   DULoxetine (CYMBALTA) 60 MG capsule Take 60 mg by mouth daily.   gabapentin (NEURONTIN) 300 MG capsule Take 900 mg by mouth 3 (three) times daily.   Melatonin 1 MG TABS Take 1 tablet by mouth at bedtime.   pravastatin (PRAVACHOL) 40 MG tablet Take 40 mg by mouth daily.   traZODone (DESYREL) 150 MG tablet Take by mouth at bedtime.   [DISCONTINUED] buPROPion (WELLBUTRIN XL) 150 MG 24 hr tablet Take 150 mg by mouth daily.   [DISCONTINUED] hydrochlorothiazide (HYDRODIURIL) 25 MG tablet Take 12.5 mg by mouth daily.   [DISCONTINUED] ibuprofen (ADVIL,MOTRIN) 600 MG tablet Take 600 mg by mouth every 8 (eight) hours as needed.   [DISCONTINUED] lansoprazole (PREVACID) 30 MG capsule Take 30 mg by mouth daily at 12 noon.   [DISCONTINUED] ondansetron (ZOFRAN) 4 MG tablet Take 4 mg by mouth 2 (two) times daily.   [DISCONTINUED] silver sulfADIAZINE (SILVADENE) 1 % cream Apply 1 application topically 2 (two) times daily.   [DISCONTINUED] tolterodine (DETROL) 2 MG tablet Take 2 mg by mouth 2 (two) times daily.   [DISCONTINUED] vitamin C (ASCORBIC ACID) 500 MG tablet Take 500 mg by mouth daily.   No facility-administered encounter medications on file as of 08/23/2021.    Past Medical History:  Diagnosis Date   Arthritis    Breast cancer ([place])  01/2017   DCIS right breast   Chronic pain    Right hip and leg   COPD (chronic obstructive pulmonary disease) ([facility])    GERD (gastroesophageal reflux disease)    Hyperlipidemia    Hypertension    Neuromuscular disorder ([place])    Skin cancer     Past Surgical History:  Procedure Laterality Date   BACK SURGERY     BREAST LUMPECTOMY Right 02/03/2017   TOTAL HIP ARTHROPLASTY      History reviewed. No pertinent family history.  Social History   Socioeconomic History   Marital status: Married    Spouse name: Not on file   Number of children: Not on file   Years of education: Not on file   Highest education level: Not on file  Occupational History   Not on file  Tobacco Use   Smoking status: Every Day    Packs/day: 1.50    Years: 41.00    Total pack years: 61.50    Types: Cigarettes   Smokeless tobacco: Never  Vaping Use   Vaping Use: Never used  Substance and Sexual Activity   Alcohol use: Yes    Comment: occasional   Drug use: No   Sexual activity: Not on file  Other Topics Concern   Not on file  Social History Narrative   Not on file   Social Determinants of Health   Financial Resource Strain: Not on file  Food  Insecurity: Not on file  Transportation Needs: Not on file  Physical Activity: Not on file  Stress: Not on file  Social Connections: Not on file  Intimate Partner Violence: Not on file    Review of Systems  Constitutional:  Negative for fever and weight loss.  HENT:  Negative for ear discharge, hearing loss and sinus pain.   Eyes:  Negative for blurred vision, double vision and pain.  Respiratory:  Negative for cough, sputum production and wheezing.   Cardiovascular:  Negative for chest pain and claudication.  Gastrointestinal:  Negative for constipation, heartburn and vomiting.  Genitourinary:  Negative for dysuria.  Musculoskeletal:  Positive for back pain and joint pain. Negative for myalgias.  Skin:  Negative for rash.  Neurological:   Positive for dizziness. Negative for tingling, sensory change and headaches.  Psychiatric/Behavioral:  Negative for depression, substance abuse and suicidal ideas.         Objective    BP 136/90   Pulse 86   Wt 244 lb 9 oz (110.9 kg)   BMI 37.19 kg/m   Physical Exam Constitutional:      Appearance: Normal appearance. She is obese.  HENT:     Head: Normocephalic.     Right Ear: Tympanic membrane normal.     Left Ear: Tympanic membrane normal.     Nose: Nose normal.     Mouth/Throat:     Mouth: Mucous membranes are moist.     Pharynx: Oropharynx is clear.  Eyes:     Extraocular Movements: Extraocular movements intact.     Conjunctiva/sclera: Conjunctivae normal.     Pupils: Pupils are equal, round, and reactive to light.  Cardiovascular:     Rate and Rhythm: Normal rate and regular rhythm.     Pulses: Normal pulses.     Heart sounds: No murmur heard.    No friction rub.  Pulmonary:     Effort: Pulmonary effort is normal. No respiratory distress.     Breath sounds: No stridor.  Abdominal:     General: Bowel sounds are normal. There is no distension.     Palpations: There is no mass.  Musculoskeletal:        General: Normal range of motion.     Cervical back: Normal range of motion.  Skin:    General: Skin is warm.     Coloration: Skin is not jaundiced or pale.  Neurological:     General: No focal deficit present.     Mental Status: She is alert and oriented to person, place, and time. Mental status is at baseline.  Psychiatric:        Mood and Affect: Mood normal.        Behavior: Behavior normal.        Thought Content: Thought content normal.        Judgment: Judgment normal.         Assessment & Plan:   Problem List Items Addressed This Visit       Cardiovascular and Mediastinum   Hypertension    Patient blood pressure 136/90 in the office today. Advised patient to eat low-salt and heart healthy diet. Advised patient to check blood pressure at  home and call with the blood pressure readings. Continue the current medication. If the blood pressure remains elevated will adjust the medication.         Other   Encounter to establish care with new doctor - Primary    Care established. Labs ordered.  Dizziness    Patient orthostatic blood pressure laying 147/97, sitting 134/96 and standing 146/100. Advised patient to increase water intake. Advised to move slowly when changing positions.       Smoker    Smoking cessation was discussed, 5-7 minutes was spent of this topic specifically.         No follow-ups on file.   Theresia Lo, NP

## 2021-08-24 LAB — TSH: TSH: 1.57 u[IU]/mL (ref 0.450–4.500)

## 2021-08-24 LAB — CBC WITH DIFFERENTIAL/PLATELET
Basophils Absolute: 0 10*3/uL (ref 0.0–0.2)
Basos: 1 %
EOS (ABSOLUTE): 0.2 10*3/uL (ref 0.0–0.4)
Eos: 4 %
Hematocrit: 41.7 % (ref 34.0–46.6)
Hemoglobin: 14 g/dL (ref 11.1–15.9)
Immature Grans (Abs): 0 10*3/uL (ref 0.0–0.1)
Immature Granulocytes: 1 %
Lymphocytes Absolute: 1.6 10*3/uL (ref 0.7–3.1)
Lymphs: 25 %
MCH: 33.9 pg — ABNORMAL HIGH (ref 26.6–33.0)
MCHC: 33.6 g/dL (ref 31.5–35.7)
MCV: 101 fL — ABNORMAL HIGH (ref 79–97)
Monocytes Absolute: 0.5 10*3/uL (ref 0.1–0.9)
Monocytes: 7 %
Neutrophils Absolute: 4.1 10*3/uL (ref 1.4–7.0)
Neutrophils: 62 %
Platelets: 258 10*3/uL (ref 150–450)
RBC: 4.13 x10E6/uL (ref 3.77–5.28)
RDW: 12.8 % (ref 11.7–15.4)
WBC: 6.5 10*3/uL (ref 3.4–10.8)

## 2021-08-24 LAB — LIPID PANEL W/O CHOL/HDL RATIO
Cholesterol, Total: 234 mg/dL — ABNORMAL HIGH (ref 100–199)
HDL: 54 mg/dL (ref >39)
LDL Chol Calc (NIH): 159 mg/dL — ABNORMAL HIGH (ref 0–99)
Triglycerides: 120 mg/dL (ref 0–149)
VLDL Cholesterol Cal: 21 mg/dL (ref 5–40)

## 2021-08-25 ENCOUNTER — Other Ambulatory Visit: Payer: Self-pay | Admitting: Nurse Practitioner

## 2021-08-26 ENCOUNTER — Other Ambulatory Visit: Payer: Self-pay

## 2021-08-26 ENCOUNTER — Other Ambulatory Visit (HOSPITAL_COMMUNITY): Payer: Self-pay

## 2021-08-26 MED ORDER — IBUPROFEN 600 MG PO TABS: 600.0000 mg | ORAL_TABLET | Freq: Three times a day (TID) | ORAL | 6 refills | Status: DC | PRN

## 2021-08-26 MED ORDER — IBUPROFEN 600 MG PO TABS
600.0000 mg | ORAL_TABLET | Freq: Three times a day (TID) | ORAL | 6 refills | Status: DC | PRN
  Filled 2021-08-26: qty 30, 10d supply, fill #0

## 2021-08-29 ENCOUNTER — Other Ambulatory Visit: Payer: Self-pay

## 2021-08-30 ENCOUNTER — Ambulatory Visit (INDEPENDENT_AMBULATORY_CARE_PROVIDER_SITE_OTHER): Payer: Medicare (Managed Care) | Admitting: Nurse Practitioner

## 2021-08-30 ENCOUNTER — Encounter: Payer: Self-pay | Admitting: Nurse Practitioner

## 2021-08-30 ENCOUNTER — Ambulatory Visit: Payer: Medicare HMO | Admitting: Nurse Practitioner

## 2021-08-30 VITALS — BP 125/88 | HR 94 | Ht 68.0 in | Wt 220.0 lb

## 2021-08-30 DIAGNOSIS — R5383 Other fatigue: Secondary | ICD-10-CM | POA: Diagnosis not present

## 2021-08-30 DIAGNOSIS — E785 Hyperlipidemia, unspecified: Secondary | ICD-10-CM | POA: Diagnosis not present

## 2021-08-30 MED ORDER — IBUPROFEN 800 MG PO TABS: 800.0000 mg | ORAL_TABLET | Freq: Three times a day (TID) | ORAL | 1 refills | Status: DC | PRN

## 2021-08-30 NOTE — Progress Notes (Unsigned)
New Patient Office Visit  Subjective    Patient ID: Tara Norris, female    DOB: 05-28-1960  Age: 61 y.o. MRN: [id number]  CC:  Chief Complaint  Patient presents with   lab results     HPI Tara Norris presents for labs follow up. She also complaints of aches and pain. She had hip surgery, back surgery and had left knee pain.  ***  Outpatient Encounter Medications as of 08/30/2021  Medication Sig   aspirin EC 81 MG tablet Take 81 mg by mouth daily.   Calcium Carbonate (CALCIUM-CARB 600 PO) Take by mouth.   DULoxetine (CYMBALTA) 60 MG capsule Take 60 mg by mouth daily.   gabapentin (NEURONTIN) 300 MG capsule Take 900 mg by mouth 3 (three) times daily.   ibuprofen (ADVIL) 600 MG tablet Take 1 tablet (600 mg total) by mouth every 8 (eight) hours as needed.   Melatonin 1 MG TABS Take 1 tablet by mouth at bedtime.   pravastatin (PRAVACHOL) 40 MG tablet Take 40 mg by mouth daily.   traZODone (DESYREL) 150 MG tablet Take by mouth at bedtime.   [DISCONTINUED] buPROPion (WELLBUTRIN XL) 150 MG 24 hr tablet Take 150 mg by mouth daily.   [DISCONTINUED] hydrochlorothiazide (HYDRODIURIL) 25 MG tablet Take 12.5 mg by mouth daily.   [DISCONTINUED] lansoprazole (PREVACID) 30 MG capsule Take 30 mg by mouth daily at 12 noon.   [DISCONTINUED] ondansetron (ZOFRAN) 4 MG tablet Take 4 mg by mouth 2 (two) times daily.   [DISCONTINUED] silver sulfADIAZINE (SILVADENE) 1 % cream Apply 1 application topically 2 (two) times daily.   [DISCONTINUED] tolterodine (DETROL) 2 MG tablet Take 2 mg by mouth 2 (two) times daily.   [DISCONTINUED] vitamin C (ASCORBIC ACID) 500 MG tablet Take 500 mg by mouth daily.   No facility-administered encounter medications on file as of 08/30/2021.    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 No pertinent family history.  Social History   Socioeconomic History   Marital status: Married    Spouse name: Not on file   Number of children: Not on file   Years of education: Not on file   Highest education level: Not on file  Occupational History   Not on file  Tobacco Use   Smoking status: Every Day    Packs/day: 1.50    Years: 41.00    Total pack years: 61.50    Types: Cigarettes   Smokeless tobacco: Never  Vaping Use   Vaping Use: Never used  Substance and Sexual Activity   Alcohol use: Yes    Comment: occasional   Drug use: No   Sexual activity: Not on file  Other Topics Concern   Not on file  Social History Narrative   Not on file   Social Determinants of Health   Financial Resource Strain: Not on file  Food Insecurity: Not on file  Transportation Needs: Not on file  Physical Activity:  Not on file  Stress: Not on file  Social Connections: Not on file  Intimate Partner Violence: Not on file    Review of Systems  Constitutional:  Negative for chills and fever.  HENT:  Negative for ear discharge and hearing loss.   Cardiovascular:  Negative for chest pain and orthopnea.        Objective    BP 125/88   Pulse 94   Ht '5\' 8"'$  (1.727 m)   Wt 220 lb (99.8 kg)   BMI 33.45 kg/m   Physical Exam  {Labs (Optional):23779}    Assessment & Plan:   Problem List Items Addressed This Visit   None Visit Diagnoses     Fatigue, unspecified type    -  Primary       No follow-ups on file.   Theresia Lo, NP

## 2021-09-04 LAB — VITAMIN B12: Vitamin B-12: 525 pg/mL (ref 200–1100)

## 2021-09-04 LAB — VITAMIN D 1,25 DIHYDROXY
Vitamin D 1, 25 (OH)2 Total: 11 pg/mL — ABNORMAL LOW (ref 18–72)
Vitamin D2 1, 25 (OH)2: <8 pg/mL
Vitamin D3 1, 25 (OH)2: 11 pg/mL

## 2021-09-12 ENCOUNTER — Encounter: Payer: Self-pay | Admitting: Nurse Practitioner

## 2021-09-12 DIAGNOSIS — I10 Essential (primary) hypertension: Secondary | ICD-10-CM | POA: Insufficient documentation

## 2021-09-12 DIAGNOSIS — R42 Dizziness and giddiness: Secondary | ICD-10-CM | POA: Insufficient documentation

## 2021-09-12 DIAGNOSIS — F17200 Nicotine dependence, unspecified, uncomplicated: Secondary | ICD-10-CM | POA: Insufficient documentation

## 2021-09-12 DIAGNOSIS — R5383 Other fatigue: Secondary | ICD-10-CM | POA: Insufficient documentation

## 2021-09-12 DIAGNOSIS — E785 Hyperlipidemia, unspecified: Secondary | ICD-10-CM | POA: Insufficient documentation

## 2021-09-12 NOTE — Assessment & Plan Note (Addendum)
Patient has elevated LDL level. Lowering the levels of LDL helps reduce the risk of cardiovascular diseases.  Eat healthy diet, perform regular aerobic exercise and weight loss. Continue pravastatin 40 mg daily.

## 2021-09-12 NOTE — Assessment & Plan Note (Signed)
Smoking cessation was discussed, 5-7 minutes was spent of this topic specifically.   

## 2021-09-12 NOTE — Assessment & Plan Note (Addendum)
Patient blood pressure 136/90 in the office today. Advised patient to eat low-salt and heart healthy diet. Advised patient to check blood pressure at home and call with the blood pressure readings. Continue the current medication. If the blood pressure remains elevated will adjust the medication.

## 2021-09-12 NOTE — Assessment & Plan Note (Signed)
Care established. Labs ordered.

## 2021-09-12 NOTE — Assessment & Plan Note (Signed)
Patient feels fatigued. We will check vitamin D and B12

## 2021-09-12 NOTE — Assessment & Plan Note (Addendum)
Patient orthostatic blood pressure laying 147/97, sitting 134/96 and standing 146/100. Advised patient to increase water intake. Advised to move slowly when changing positions.

## 2021-09-23 ENCOUNTER — Other Ambulatory Visit: Payer: Self-pay | Admitting: Nurse Practitioner

## 2021-09-26 ENCOUNTER — Ambulatory Visit (INDEPENDENT_AMBULATORY_CARE_PROVIDER_SITE_OTHER): Payer: Medicare (Managed Care) | Admitting: *Deleted

## 2021-09-26 DIAGNOSIS — Z0000 Encounter for general adult medical examination without abnormal findings: Secondary | ICD-10-CM | POA: Diagnosis not present

## 2021-09-26 MED ORDER — IBUPROFEN 800 MG PO TABS: 800.0000 mg | ORAL_TABLET | Freq: Three times a day (TID) | ORAL | 3 refills | Status: DC | PRN

## 2021-09-26 NOTE — Progress Notes (Addendum)
Subjective:   Tara Norris is a 61 y.o. female who presents for an Initial Medicare Annual Wellness Visit.  I discussed the limitations of evaluation and management by telemedicine and the availability of in person appointments. Patient expressed understanding and agreed to proceed.   Visit performed using audio  Patient:home Provider:home    Review of Systems    Defer to provider        Objective:    Today's Vitals   09/30/21 1016  PainSc: 5    There is no height or weight on file to calculate BMI.     02/16/2017    2:52 PM  Advanced Directives  Does Patient Have a Medical Advance Directive? No  Would patient like information on creating a medical advance directive? No - Patient declined    Current Medications (verified) Outpatient Encounter Medications as of 09/26/2021  Medication Sig   aspirin EC 81 MG tablet Take 81 mg by mouth daily.   Calcium Carbonate (CALCIUM-CARB 600 PO) Take by mouth.   DULoxetine (CYMBALTA) 60 MG capsule Take 60 mg by mouth daily.   gabapentin (NEURONTIN) 300 MG capsule Take 900 mg by mouth 3 (three) times daily.   ibuprofen (ADVIL) 800 MG tablet Take 1 tablet (800 mg total) by mouth every 8 (eight) hours as needed.   Melatonin 1 MG TABS Take 1 tablet by mouth at bedtime.   pravastatin (PRAVACHOL) 40 MG tablet Take 40 mg by mouth daily.   traZODone (DESYREL) 150 MG tablet Take by mouth at bedtime.   [DISCONTINUED] ibuprofen (ADVIL) 800 MG tablet TAKE 1 TABLET EVERY 8 HOURS AS NEEDED   No facility-administered encounter medications on file as of 09/26/2021.    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 No pertinent family history. Social History   Socioeconomic History   Marital status: Married    Spouse name: Not on file   Number of children: Not on file   Years of education: Not on file   Highest education level: Not on file  Occupational History   Not on file  Tobacco Use   Smoking status: Every Day    Packs/day: 1.50    Years: 41.00    Total pack years: 61.50    Types: Cigarettes   Smokeless tobacco: Never  Vaping Use   Vaping Use: Never used  Substance and Sexual Activity   Alcohol use: Yes    Comment: occasional   Drug use: No   Sexual activity: Not on file  Other Topics Concern   Not on file  Social History Narrative   Not on file   Social Determinants of Health   Financial Resource Strain: High Risk (09/30/2021)   Overall Financial Resource Strain (CARDIA)    Difficulty of Paying Living Expenses: Very hard  Food Insecurity: Food Insecurity Present (09/30/2021)   Hunger Vital Sign    Worried About Running Out of  Food in the Last Year: Often true    Ran Out of Food in the Last Year: Often true  Transportation Needs: No Transportation Needs (09/30/2021)   PRAPARE - Hydrologist (Medical): No    Lack of Transportation (Non-Medical): No  Physical Activity: Insufficiently Active (09/30/2021)   Exercise Vital Sign    Days of Exercise per Week: 1 day    Minutes of Exercise per Session: 30 min  Stress: Stress Concern Present (09/30/2021)   [place]    Feeling of Stress : Very much  Social Connections: Moderately Isolated (09/30/2021)   Social Connection and Isolation Panel [NHANES]    Frequency of Communication with Friends and Family: More than three times a week    Frequency of Social  Gatherings with Friends and Family: Never    Attends Religious Services: Never    Printmaker: No    Attends Music therapist: Never    Marital Status: Married    Tobacco Counseling Ready to quit: Not Answered Counseling given: Not Answered   Clinical Intake:  Pre-visit preparation completed: Yes  Pain : 0-10 Pain Score: 5  Pain Type: Chronic pain Pain Relieving Factors: patient takes rx strenght ibuprofen  Pain Relieving Factors: patient takes rx strenght ibuprofen     How often do you need to have someone help you when you read instructions, pamphlets, or other written materials from your doctor or pharmacy?: 1 - Never  Diabetic?no     Information entered by :: Lacretia Nicks, [place] of Daily Living    09/30/2021   10:27 AM 09/26/2021    1:14 PM  In your present state of health, do you have any difficulty performing the following activities:  Hearing? 0 0  Vision? 0 1  Difficulty concentrating or making decisions? 0 1  Walking or climbing stairs?  1  Dressing or bathing? 0 0  Doing errands, shopping? 0 0  Preparing Food and eating ? N N  Using the Toilet? N N  In the past six months, have you accidently leaked urine? Y Y  Do you have problems with loss of bowel control? N N  Managing your Medications? N N  Managing your Finances? N N  Housekeeping or managing your Housekeeping? N N    Patient Care Team: Theresia Lo, NP as PCP - General (Nurse Practitioner)  Indicate any recent Medical Services you may have received from other than [facility] providers in the past year (date may be approximate).     Assessment:   This is a routine wellness examination for Tara Norris.  Hearing/Vision screen No results found.  Dietary issues and exercise activities discussed: Current Exercise Habits: The patient does not participate in regular exercise at present   Goals Addressed   None    Depression Screen     09/30/2021   10:20 AM 08/30/2021   10:30 AM 02/16/2017    2:53 PM  PHQ 2/9 Scores  PHQ - 2 Score 0 0 0    Fall Risk    09/26/2021    1:14 PM 08/30/2021   10:30 AM 02/16/2017    2:53 PM  [place] in the past year? 0 0 No  Number falls in past yr: 0 0   Injury with Fall? 0 0   Risk for fall due to :  No Fall Risks   Follow up  Falls  evaluation completed     FALL RISK PREVENTION PERTAINING TO THE HOME:  Any stairs in or around the home? No  If so, are there any without handrails? No  Home free of loose throw rugs in walkways, pet beds, electrical cords, etc? Yes  Adequate lighting in your home to reduce risk of falls? Yes   ASSISTIVE DEVICES UTILIZED TO PREVENT FALLS:  Life alert? No  Use of a cane, walker or w/c? No  Grab bars in the bathroom? No  Shower chair or bench in shower? No  Elevated toilet seat or a handicapped toilet? No   TIMED UP AND GO:  Was the test performed? No .      Cognitive Function:    09/30/2021   10:28 AM  MMSE - Mini Mental State Exam  Not completed: Unable to complete        09/30/2021   10:28 AM  6CIT Screen  What Year? 0 points  What month? 0 points  What time? 0 points  Count back from 20 0 points  Months in reverse 0 points  Repeat phrase 0 points  Total Score 0 points    Immunizations Immunization History  Administered Date(s) Administered   Influenza, Seasonal, Injecte, Preservative Fre 01/07/2011, 01/09/2012, 12/24/2012   Influenza,inj,Quad PF,6+ Mos 12/04/2014, 12/31/2015, 12/31/2016, 02/28/2020, 04/19/2021   Influenza,inj,quad, With Preservative 12/14/2018   Influenza-Unspecified 12/31/2016   Moderna Sars-Covid-2 Vaccination 06/29/2019, 07/27/2019   Pneumococcal Polysaccharide-23 07/22/2011, 12/31/2016   Tdap 02/15/2018   Zoster Recombinat (Shingrix) 12/08/2017, 02/09/2018    TDAP status: Up to date  Flu Vaccine status: Up to date  Pneumococcal vaccine status: Up to date  Covid-19 vaccine status:  Information provided on how to obtain vaccines.   Qualifies for Shingles Vaccine? Yes   Zostavax completed No   Shingrix Completed?: Yes  Screening Tests Health Maintenance  Topic Date Due   HIV Screening  Never done   Hepatitis C Screening  Never done   PAP SMEAR-Modifier  Never done   COLONOSCOPY (Pts 45-58yr Insurance coverage will need to be confirmed)  Never done   MAMMOGRAM  03/17/2019   COVID-19 Vaccine (3 - Moderna series) 09/21/2019   INFLUENZA VACCINE  10/15/2021   TETANUS/TDAP  02/16/2028   Zoster Vaccines- Shingrix  Completed   HPV VACCINES  Aged Out    Health Maintenance  Health Maintenance Due  Topic Date Due   HIV Screening  Never done   Hepatitis C Screening  Never done   PAP SMEAR-Modifier  Never done   COLONOSCOPY (Pts 45-489yrInsurance coverage will need to be confirmed)  Never done   MAMMOGRAM  03/17/2019   COVID-19 Vaccine (3 - Moderna series) 09/21/2019    Colorectal cancer screening: Type of screening: Colonoscopy. Completed 02/02/2017. Repeat every 10 years  Mammogram status: Completed 03/16/2017. Repeat every year   Lung Cancer Screening: (Low Dose CT Chest recommended if Age 60-80ears, 30 pack-year currently smoking OR have quit w/in 15years.) does qualify.    Additional Screening:  Hepatitis C Screening: does qualify; Complete with next labs   Vision Screening: Recommended annual ophthalmology exams for early detection of glaucoma and other disorders of the eye. Is the patient up to date with their annual eye exam?  Yes  Who is the provider or what is the name of the office in which the patient attends annual eye exams? [place]  If pt is not established with a provider, would they like to be referred to a provider to establish care?  No .   Dental Screening: Recommended annual dental exams for proper oral hygiene  Community Resource Referral / Chronic Care Management: CRR required this visit?  No   CCM required this visit?  No       Plan:     I have personally reviewed and noted the following in the patient's chart:   Medical and social history Use of alcohol, tobacco or illicit drugs  Current medications and supplements including opioid prescriptions. Patient is not currently taking opioid prescriptions. Functional ability and status Nutritional status Physical activity Advanced directives List of other physicians Hospitalizations, surgeries, and ER visits in previous 12 months Vitals Screenings to include cognitive, depression, and falls Referrals and appointments  In addition, I have reviewed and discussed with patient certain preventive protocols, quality metrics, and best practice recommendations. A written personalized care plan for preventive services as well as general preventive health recommendations were provided to patient.     Lacretia Nicks, [place]   09/30/2021   Nurse Notes:  Ms. Corporan , Thank you for taking time to come for your Medicare Wellness Visit. I appreciate your ongoing commitment to your health goals. Please review the following plan we discussed and let me know if I can assist you in the future.   These are the goals we discussed:  Goals   None     This is a list of the screening recommended for you and due dates:  Health Maintenance  Topic Date Due   HIV Screening  Never done   Hepatitis C Screening: USPSTF Recommendation to screen - Ages 56-79 yo.  Never done   Pap Smear  Never done   Colon Cancer Screening  Never done   Mammogram  03/17/2019   COVID-19 Vaccine (3 - Moderna series) 09/21/2019   Flu Shot  10/15/2021   Tetanus Vaccine  02/16/2028   Zoster (Shingles) Vaccine  Completed   HPV Vaccine  Aged Out     I have reviewed and agreed to the above documentation.   Theresia Lo, FNP

## 2021-11-05 DIAGNOSIS — H25813 Combined forms of age-related cataract, bilateral: Secondary | ICD-10-CM | POA: Diagnosis not present

## 2021-12-03 DIAGNOSIS — Z0000 Encounter for general adult medical examination without abnormal findings: Secondary | ICD-10-CM | POA: Diagnosis not present

## 2021-12-12 DIAGNOSIS — H43813 Vitreous degeneration, bilateral: Secondary | ICD-10-CM | POA: Diagnosis not present

## 2022-01-16 ENCOUNTER — Other Ambulatory Visit: Payer: Self-pay | Admitting: *Deleted

## 2022-01-16 MED ORDER — IBUPROFEN 800 MG PO TABS: 800.0000 mg | ORAL_TABLET | Freq: Three times a day (TID) | ORAL | 3 refills | Status: AC | PRN

## 2022-01-16 MED ORDER — GABAPENTIN 300 MG PO CAPS: 900.0000 mg | ORAL_CAPSULE | Freq: Three times a day (TID) | ORAL | 3 refills | Status: AC

## 2022-01-16 MED ORDER — DULOXETINE HCL 60 MG PO CPEP: 60.0000 mg | ORAL_CAPSULE | Freq: Every day | ORAL | 3 refills | Status: AC

## 2022-01-16 MED ORDER — TRAZODONE HCL 150 MG PO TABS: 150.0000 mg | ORAL_TABLET | Freq: Every day | ORAL | 3 refills | Status: AC

## 2022-01-16 MED ORDER — PRAVASTATIN SODIUM 40 MG PO TABS: 40.0000 mg | ORAL_TABLET | Freq: Every day | ORAL | 3 refills | Status: AC

## 2022-01-16 MED ORDER — CYCLOBENZAPRINE HCL 10 MG PO TABS: 10.0000 mg | ORAL_TABLET | Freq: Every day | ORAL | 3 refills | Status: AC

## 2022-07-31 ENCOUNTER — Telehealth: Payer: Self-pay

## 2022-07-31 NOTE — Transitions of Care (Post Inpatient/ED Visit) (Signed)
Unable to reach pt by phone and left v/m requesting pt to call [phone].      07/31/2022  Name: Tara Norris MRN: [id number] DOB: [birth year]  Today's TOC FU Call Status: Today's TOC FU Call Status:: Unsuccessul Call (1st Attempt) Unsuccessful Call (1st Attempt) Date: 07/31/22  Attempted to reach the patient regarding the most recent Inpatient/ED visit.  Follow Up Plan: Additional outreach attempts will be made to reach the patient to complete the Transitions of Care (Post Inpatient/ED visit) call.   Signature Lewanda Rife, LPN

## 2022-08-01 NOTE — Transitions of Care (Post Inpatient/ED Visit) (Signed)
Unable to reach pt by phone and left v/m for pt to call [phone].       08/01/2022  Name: Tara Norris MRN: [id number] DOB: 1961/01/03  Today's TOC FU Call Status: Today's TOC FU Call Status:: Unsuccessful Call (2nd Attempt) Unsuccessful Call (1st Attempt) Date: 07/31/22 Unsuccessful Call (2nd Attempt) Date: 08/01/22  Attempted to reach the patient regarding the most recent Inpatient/ED visit.  Follow Up Plan: Additional outreach attempts will be made to reach the patient to complete the Transitions of Care (Post Inpatient/ED visit) call.   Signature Lewanda Rife, LPN

## 2022-10-03 ENCOUNTER — Telehealth: Payer: Self-pay | Admitting: Nurse Practitioner

## 2022-10-03 NOTE — Telephone Encounter (Signed)
Letter mailed due for colon cancer screening.

## 2022-12-19 ENCOUNTER — Other Ambulatory Visit: Payer: Self-pay | Admitting: Nurse Practitioner

## 2022-12-19 DIAGNOSIS — Z1212 Encounter for screening for malignant neoplasm of rectum: Secondary | ICD-10-CM

## 2022-12-19 DIAGNOSIS — Z1211 Encounter for screening for malignant neoplasm of colon: Secondary | ICD-10-CM

## 2022-12-31 ENCOUNTER — Other Ambulatory Visit: Payer: Self-pay | Admitting: Nurse Practitioner

## 2022-12-31 NOTE — Telephone Encounter (Signed)
Can you please update patient's record, she is followed by another PCP at [facility]. Thank you

## 2023-03-27 ENCOUNTER — Other Ambulatory Visit: Payer: Self-pay
